# Patient Record
Sex: Female | Born: 1990 | Race: White | Hispanic: No | Marital: Single | Smoking: Former smoker
Health system: Southern US, Community
[De-identification: ages and names within clinical notes are randomized; demographics above are authoritative.]

## PROBLEM LIST (undated history)

## (undated) DIAGNOSIS — F319 Bipolar disorder, unspecified: Secondary | ICD-10-CM

## (undated) DIAGNOSIS — F1910 Other psychoactive substance abuse, uncomplicated: Secondary | ICD-10-CM

## (undated) DIAGNOSIS — B1920 Unspecified viral hepatitis C without hepatic coma: Secondary | ICD-10-CM

## (undated) DIAGNOSIS — D649 Anemia, unspecified: Secondary | ICD-10-CM

## (undated) HISTORY — PX: NO PAST SURGERIES: SHX2092

## (undated) HISTORY — DX: Bipolar disorder, unspecified: F31.9

## (undated) HISTORY — DX: Anemia, unspecified: D64.9

## (undated) HISTORY — DX: Other psychoactive substance abuse, uncomplicated: F19.10

## (undated) HISTORY — PX: OTHER SURGICAL HISTORY: SHX169

## (undated) HISTORY — PX: DENTAL SURGERY: SHX609

---
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
## 2018-04-23 NOTE — L&D Delivery Note (Signed)
Vaginal Delivery Note  Spontaneous delivery of live viable female infant from the ROA position through an episiotomy. Nuchal cord reduced x2. Delivery of anterior left shoulder with gentle downward guidance followed by delivery of the right posterior shoulder with gentle upward guidance. Body followed spontaneously. Infant placed on maternal chest. Nursery present and helped with neonatal resuscitation and evaluation. Cord clamped and cut after one minute. Cord blood not collected. Placenta delivered spontaneously and intact with a 3 vessel cord.  2nd degree laceration. Uterus firm and below umbilicus at the end of the delivery.  Mom and baby recovering in stable condition. Sponge and needle counts were correct at the end of the delivery.  APGARS: 1 minute:8 5 minutes: 9 Weight: 6lbs 11 oz Name: Brand Males MD [facility] 01/16/19 12:38 AM

## 2018-10-26 ENCOUNTER — Emergency Department
Admission: EM | Admit: 2018-10-26 | Discharge: 2018-10-26 | Disposition: A | Discharge status: Home / Self Care | Payer: Medicaid Other | Attending: Emergency Medicine | Admitting: Emergency Medicine

## 2018-10-26 ENCOUNTER — Encounter: Payer: Self-pay | Admitting: Emergency Medicine

## 2018-10-26 ENCOUNTER — Other Ambulatory Visit: Payer: Self-pay

## 2018-10-26 DIAGNOSIS — F17210 Nicotine dependence, cigarettes, uncomplicated: Secondary | ICD-10-CM | POA: Diagnosis not present

## 2018-10-26 DIAGNOSIS — O99322 Drug use complicating pregnancy, second trimester: Secondary | ICD-10-CM | POA: Insufficient documentation

## 2018-10-26 DIAGNOSIS — F1190 Opioid use, unspecified, uncomplicated: Secondary | ICD-10-CM | POA: Insufficient documentation

## 2018-10-26 DIAGNOSIS — Z7689 Persons encountering health services in other specified circumstances: Secondary | ICD-10-CM | POA: Insufficient documentation

## 2018-10-26 DIAGNOSIS — Z3A27 27 weeks gestation of pregnancy: Secondary | ICD-10-CM | POA: Diagnosis not present

## 2018-10-26 DIAGNOSIS — O99332 Smoking (tobacco) complicating pregnancy, second trimester: Secondary | ICD-10-CM | POA: Diagnosis not present

## 2018-10-26 MED ORDER — METHADONE HCL 10 MG/ML PO CONC
90.0000 mg | Freq: Once | ORAL | Status: AC
  Administered 2018-10-26: 90 mg via ORAL
  Filled 2018-10-26: qty 9

## 2018-10-26 NOTE — ED Triage Notes (Signed)
Pt here for dose of methadone, was seen at [facility] for the same yesterday, and will need another dose tomorrow. No complaints at this time.

## 2018-10-26 NOTE — ED Notes (Signed)
FIRST NURSE NOTE:  Pt here for dose of Methadone, pt does not have appt until Monday for Methadone clinic, Pt seen at [facility] yesterday and day before for same reason.

## 2018-10-26 NOTE — ED Notes (Signed)
See triage note  States she is here for her Methadone dose  Was given 1 yesterday at [facility]

## 2018-10-26 NOTE — Discharge Instructions (Signed)
Keep your intake appointment at the clinic on Tuesday.

## 2018-10-26 NOTE — ED Provider Notes (Signed)
[facility] Emergency Department Provider Note  ___________________________________________   First MD Initiated Contact with Patient 10/26/18 1302     (approximate)  I have reviewed the triage vital signs and the nursing notes.   HISTORY  Chief Complaint Med administration   HPI Margaret Krueger is a 28 y.o. female presents to the ED for a dose of her methadone.  Patient recently moved from [place] where she was being seen at a methadone clinic.  Patient currently is [redacted] weeks pregnant.  She is scheduled to have an intake appointment at a clinic on Tuesday.  Patient was seen at [facility] yesterday and the clinic that she was seen in [place] was called.  He was confirmed that her dose was 90 mg once a day.  Patient denies any other symptoms or complaints.      History reviewed. No pertinent past medical history.  There are no active problems to display for this patient.   History reviewed. No pertinent surgical history.  Prior to Admission medications   Not on File    Allergies Patient has no known allergies.  No family history on file.  Social History Social History   Tobacco Use  . Smoking status: Current Every Day Smoker    Packs/day: 0.50    Types: Cigarettes  . Smokeless tobacco: Never Used  Substance Use Topics  . Alcohol use: Not Currently  . Drug use: Not on file    Review of Systems Constitutional: No fever/chills Eyes: No visual changes. ENT: No sore throat. Cardiovascular: Denies chest pain. Respiratory: Denies shortness of breath. Gastrointestinal: No abdominal pain.  No nausea, no vomiting.  No diarrhea.  No constipation. Genitourinary: Negative for dysuria. Musculoskeletal: Negative for back pain. Skin: Negative for rash. Neurological: Negative for headaches, focal weakness or numbness. ___________________________________________   PHYSICAL EXAM:  VITAL SIGNS: ED Triage Vitals  Enc Vitals Group     BP  10/26/18 1307 127/68     Pulse Rate 10/26/18 1307 (!) 102     Resp --      Temp 10/26/18 1307 98.3 F (36.8 C)     Temp Source 10/26/18 1307 Oral     SpO2 10/26/18 1307 99 %     Weight 10/26/18 1229 160 lb (72.6 kg)     Height 10/26/18 1229 5\' 2"  (1.575 m)     Head Circumference --      Peak Flow --      Pain Score 10/26/18 1229 0     Pain Loc --      Pain Edu? --      Excl. in GC? --    Constitutional: Alert and oriented. Well appearing and in no acute distress. Eyes: Conjunctivae are normal.  Head: Atraumatic. Neck: No stridor.   Cardiovascular: Normal rate, regular rhythm. Grossly normal heart sounds.  Good peripheral circulation. Respiratory: Normal respiratory effort.  No retractions. Lungs CTAB. Musculoskeletal: Moves upper and lower extremities and normal gait was noted. Neurologic:  Normal speech and language. No gross focal neurologic deficits are appreciated. No gait instability. Skin:  Skin is warm, dry and intact. No rash noted. Psychiatric: Mood and affect are normal. Speech and behavior are normal.  ____________________________________________   LABS (all labs ordered are listed, but only abnormal results are displayed)  Labs Reviewed - No data to display  PROCEDURES  Procedure(s) performed (including Critical Care):  Procedures   ____________________________________________   INITIAL IMPRESSION / ASSESSMENT AND PLAN / ED COURSE  As part of my  medical decision making, I reviewed the following data within the electronic MEDICAL RECORD NUMBER Notes from prior ED visits and [place] Controlled Substance Database  28 year old female presents to the ED for medication administration.  Patient moved from [place] where she was being seen at a methadone clinic.  She was seen yesterday at [facility] who was able to confirm with her clinic and [place] her dosage.  Patient denies any other symptoms or complaints.  She has an appointment with a clinic on Tuesday.   ____________________________________________   FINAL CLINICAL IMPRESSION(S) / ED DIAGNOSES  Final diagnoses:  Encounter for medication administration     ED Discharge Orders    None       Note:  This document was prepared using Dragon voice recognition software and may include unintentional dictation errors.    Johnn Hai, PA-C 10/26/18 1400    Delman Kitten, MD 10/26/18 [phone]

## 2018-10-27 ENCOUNTER — Emergency Department
Admission: EM | Admit: 2018-10-27 | Discharge: 2018-10-27 | Disposition: A | Discharge status: Home / Self Care | Payer: Medicaid Other | Attending: Emergency Medicine | Admitting: Emergency Medicine

## 2018-10-27 ENCOUNTER — Other Ambulatory Visit: Payer: Self-pay

## 2018-10-27 DIAGNOSIS — F17210 Nicotine dependence, cigarettes, uncomplicated: Secondary | ICD-10-CM | POA: Diagnosis not present

## 2018-10-27 DIAGNOSIS — Z008 Encounter for other general examination: Secondary | ICD-10-CM | POA: Insufficient documentation

## 2018-10-27 DIAGNOSIS — Z79899 Other long term (current) drug therapy: Secondary | ICD-10-CM | POA: Diagnosis not present

## 2018-10-27 MED ORDER — METHADONE HCL 10 MG PO TABS
90.0000 mg | ORAL_TABLET | Freq: Once | ORAL | Status: AC
  Administered 2018-10-27: 90 mg via ORAL
  Filled 2018-10-27: qty 9

## 2018-10-27 NOTE — ED Provider Notes (Signed)
[facility] Emergency Department Provider Note  ____________________________________________  Time seen: Approximately 5:09 PM  I have reviewed the triage vital signs and the nursing notes.   HISTORY  Chief Complaint Other (Needs Methadone)    HPI Margaret Krueger is a 28 y.o. female presents emergency department for her methadone dose.  Patient moved here from [place] and has been unable to get into a methadone clinic over 4 July holiday weekend.  Patient states that she has an intake appointment tomorrow before 9 AM.  She believes that it is in [place] but her boyfriend has the details.  She has been on methadone for about 1 month.  She states that she will go through withdrawals if she does not take this medication.  She takes 90 mg.  She was given referrals to OB[facility]N both at [facility] and here over the weekend but has not yet established care.  She is waiting for her mother-in-law, who has pinkeye to be able to go with her.  No concerns.   History reviewed. No pertinent past medical history.  There are no active problems to display for this patient.   History reviewed. No pertinent surgical history.  Prior to Admission medications   Not on File    Allergies Patient has no known allergies.  No family history on file.  Social History Social History   Tobacco Use  . Smoking status: Current Every Day Smoker    Packs/day: 0.50    Types: Cigarettes  . Smokeless tobacco: Never Used  Substance Use Topics  . Alcohol use: Not Currently  . Drug use: Not on file     Review of Systems  Respiratory: No SOB. Gastrointestinal: No abdominal pain.  No nausea, no vomiting.  Musculoskeletal: Negative for musculoskeletal pain. Skin: Negative for rash, abrasions, lacerations, ecchymosis.   ____________________________________________   PHYSICAL EXAM:  VITAL SIGNS: ED Triage Vitals  Enc Vitals Group     BP 10/27/18 1535 130/74     Pulse Rate 10/27/18  1535 (!) 108     Resp 10/27/18 1535 18     Temp 10/27/18 1535 98 F (36.7 C)     Temp Source 10/27/18 1535 Oral     SpO2 10/27/18 1535 98 %     Weight 10/27/18 1536 160 lb (72.6 kg)     Height 10/27/18 1536 5\' 2"  (1.575 m)     Head Circumference --      Peak Flow --      Pain Score --      Pain Loc --      Pain Edu? --      Excl. in GC? --      Constitutional: Alert and oriented. Well appearing and in no acute distress. Eyes: Conjunctivae are normal. PERRL. EOMI. Head: Atraumatic. ENT:      Ears:      Nose: No congestion/rhinnorhea.      Mouth/Throat: Mucous membranes are moist.  Neck: No stridor.  Cardiovascular: Good peripheral circulation. Respiratory: Normal respiratory effort without tachypnea or retractions. Musculoskeletal: Full range of motion to all extremities. No gross deformities appreciated. Neurologic:  Normal speech and language. No gross focal neurologic deficits are appreciated.  Skin:  Skin is warm, dry and intact. No rash noted. Psychiatric: Mood and affect are normal. Speech and behavior are normal. Patient exhibits appropriate insight and judgement.   ____________________________________________   LABS (all labs ordered are listed, but only abnormal results are displayed)  Labs Reviewed - No data to display ____________________________________________  EKG   ____________________________________________  RADIOLOGY   No results found.  ____________________________________________    PROCEDURES  Procedure(s) performed:    Procedures    Medications  methadone (DOLOPHINE) tablet 90 mg (90 mg Oral Given 10/27/18 1738)     ____________________________________________   INITIAL IMPRESSION / ASSESSMENT AND PLAN / ED COURSE  Pertinent labs & imaging results that were available during my care of the patient were reviewed by me and considered in my medical decision making (see chart for details).  Review of the [place] CSRS was performed  in accordance of the [place] prior to dispensing any controlled drugs.     Patient presented to the emergency department for her daily methadone dose.  Vital signs and exam are reassuring.  Methadone dose was confirmed by [facility] on Saturday with her provider in [place].  90 mg of methadone was given in the emergency department.  Patient has her intake appointment tomorrow morning prior to 9 AM.  Patient is to follow up with methadone clinic and OB as directed. Patient is given ED precautions to return to the ED for any worsening or new symptoms.  Margaret Krueger was evaluated in Emergency Department on 10/27/2018 for the symptoms described in the history of present illness. She was evaluated in the context of the global COVID-19 pandemic, which necessitated consideration that the patient might be at risk for infection with the SARS-CoV-2 virus that causes COVID-19. Institutional protocols and algorithms that pertain to the evaluation of patients at risk for COVID-19 are in a state of rapid change based on information released by regulatory bodies including the CDC and federal and state organizations. These policies and algorithms were followed during the patient's care in the ED.  ____________________________________________  FINAL CLINICAL IMPRESSION(S) / ED DIAGNOSES  Final diagnoses:  Medication management      NEW MEDICATIONS STARTED DURING THIS VISIT:  ED Discharge Orders    None          This chart was dictated using voice recognition software/Dragon. Despite best efforts to proofread, errors can occur which can change the meaning. Any change was purely unintentional.    Laban Emperor, PA-C 10/27/18 1817    Earleen Newport, MD 10/27/18 Bosie Helper

## 2018-10-27 NOTE — ED Notes (Signed)
See triage note  Presents for Methadone dose  States she takes 90 mg daily  Is scheduled to started with clinic tomorrow

## 2018-10-27 NOTE — ED Triage Notes (Signed)
Pt to ER requesting her methadone dose. Pt had recent move and does not have her intake appt for methadone clinic tomorrow. No complaints. Pt alert and oriented X4, active, cooperative, pt in NAD. RR even and unlabored, color WNL.

## 2018-10-27 NOTE — Discharge Instructions (Addendum)
Please follow-up with methadone clinic and also establish with OB.

## 2018-11-14 ENCOUNTER — Other Ambulatory Visit: Payer: Self-pay

## 2018-11-14 ENCOUNTER — Emergency Department
Admission: EM | Admit: 2018-11-14 | Discharge: 2018-11-14 | Disposition: A | Discharge status: Home / Self Care | Payer: Medicaid Other | Attending: Emergency Medicine | Admitting: Emergency Medicine

## 2018-11-14 ENCOUNTER — Encounter: Payer: Self-pay | Admitting: Emergency Medicine

## 2018-11-14 DIAGNOSIS — F1190 Opioid use, unspecified, uncomplicated: Secondary | ICD-10-CM | POA: Insufficient documentation

## 2018-11-14 DIAGNOSIS — Z760 Encounter for issue of repeat prescription: Secondary | ICD-10-CM

## 2018-11-14 DIAGNOSIS — O99332 Smoking (tobacco) complicating pregnancy, second trimester: Secondary | ICD-10-CM | POA: Insufficient documentation

## 2018-11-14 DIAGNOSIS — O99322 Drug use complicating pregnancy, second trimester: Secondary | ICD-10-CM | POA: Diagnosis not present

## 2018-11-14 DIAGNOSIS — F17210 Nicotine dependence, cigarettes, uncomplicated: Secondary | ICD-10-CM | POA: Diagnosis not present

## 2018-11-14 MED ORDER — METHADONE HCL 10 MG PO TABS
35.0000 mg | ORAL_TABLET | Freq: Once | ORAL | Status: AC
  Administered 2018-11-14: 35 mg via ORAL
  Filled 2018-11-14: qty 4

## 2018-11-14 NOTE — ED Triage Notes (Signed)
Pt presents to ED via POV with c/o medication refill. Pt states receives 90mg  Methadone from the methadone clinic daily. Pt states she is [redacted] weeks pregnant at this time, missed her appt for today and was directed here.

## 2018-11-14 NOTE — ED Provider Notes (Signed)
[facility] Emergency Department Provider Note  ____________________________________________   I have reviewed the triage vital signs and the nursing notes. Where available I have reviewed prior notes and, if possible and indicated, outside hospital notes.   Patient seen and evaluated during the coronavirus epidemic during a time with low staffing  Patient seen for the symptoms described in the history of present illness. She was evaluated in the context of the global COVID-19 pandemic, which necessitated consideration that the patient might be at risk for infection with the SARS-CoV-2 virus that causes COVID-19. Institutional protocols and algorithms that pertain to the evaluation of patients at risk for COVID-19 are in a state of rapid change based on information released by regulatory bodies including the CDC and federal and state organizations. These policies and algorithms were followed during the patient's care in the ED.    HISTORY  Chief Complaint Medication Refill    HPI Margaret Krueger is a 28 y.o. female who presents today complaining of wanting methadone.  Patient is on baseline 90 mg of methadone a day.  She mated to her methadone clinic have her lately did not give her the methadone.  She is [redacted] weeks pregnant does not want to go through withdrawal.  No withdrawal symptoms.  No vaginal cramping or bleeding.  She is not experiencing abdominal pain or complications of pregnancy.  Is feeling the baby move.  No other complaints     History reviewed. No pertinent past medical history.  There are no active problems to display for this patient.   History reviewed. No pertinent surgical history.  Prior to Admission medications   Not on File    Allergies Patient has no known allergies.  No family history on file.  Social History Social History   Tobacco Use  . Smoking status: Current Every Day Smoker    Packs/day: 0.50    Types: Cigarettes  .  Smokeless tobacco: Never Used  Substance Use Topics  . Alcohol use: Not Currently  . Drug use: Not Currently    Review of Systems Constitutional: No fever/chills Eyes: No visual changes. ENT: No sore throat. No stiff neck no neck pain Cardiovascular: Denies chest pain. Respiratory: Denies shortness of breath. Gastrointestinal:   no vomiting.  No diarrhea.  No constipation. Genitourinary: Negative for dysuria. Musculoskeletal: Negative lower extremity swelling Skin: Negative for rash. Neurological: Negative for severe headaches, focal weakness or numbness.   ____________________________________________   PHYSICAL EXAM:  VITAL SIGNS: ED Triage Vitals  Enc Vitals Group     BP 11/14/18 1304 116/67     Pulse Rate 11/14/18 1304 (!) 107     Resp 11/14/18 1304 20     Temp 11/14/18 1304 98.8 F (37.1 C)     Temp Source 11/14/18 1304 Oral     SpO2 11/14/18 1304 98 %     Weight 11/14/18 1303 160 lb (72.6 kg)     Height 11/14/18 1303 5\' 3"  (1.6 m)     Head Circumference --      Peak Flow --      Pain Score 11/14/18 1305 0     Pain Loc --      Pain Edu? --      Excl. in [place]? --     Constitutional: Alert and oriented. Well appearing and in no acute distress. Eyes: Conjunctivae are normal Head: Atraumatic HEENT: No congestion/rhinnorhea. Mucous membranes are moist.  Oropharynx non-erythematous Neck:   Nontender with no meningismus, no masses, no stridor  Cardiovascular: Normal rate, regular rhythm. Grossly normal heart sounds.  Good peripheral circulation. Respiratory: Normal respiratory effort.  No retractions. Lungs CTAB. Abdominal: Soft and nontender. No distention. No guarding no rebound gravid uterus palpated    ____________________________________________   LABS (all labs ordered are listed, but only abnormal results are displayed)  Labs Reviewed - No data to display  Pertinent labs  results that were available during my care of the patient were reviewed by me and  considered in my medical decision making (see chart for details). ____________________________________________  EKG  I personally interpreted any EKGs ordered by me or triage  ____________________________________________  RADIOLOGY  Pertinent labs & imaging results that were available during my care of the patient were reviewed by me and considered in my medical decision making (see chart for details). If possible, patient and/or family made aware of any abnormal findings.  No results found. ____________________________________________    PROCEDURES  Procedure(s) performed: None  Procedures  Critical Care performed: None  ____________________________________________   INITIAL IMPRESSION / ASSESSMENT AND PLAN / ED COURSE  Pertinent labs & imaging results that were available during my care of the patient were reviewed by me and considered in my medical decision making (see chart for details).   Patient presents for the 6 time  to emergency departments this month since moving to [place] for methadone.  I have explained her this is not appropriate.  Obviously I do not want her to withdraw from methadone either.  Patient [facility].  I will give her 35 mg which should be enough to forestall withdrawal but again have explained to her that this is not the role of the emergency department.  Did attempt to call her methadone clinic but there was no answer.    ____________________________________________   FINAL CLINICAL IMPRESSION(S) / ED DIAGNOSES  Final diagnoses:  Medication refill      This chart was dictated using voice recognition software.  Despite best efforts to proofread,  errors can occur which can change meaning.      Jeanmarie PlantMcShane,  A, MD 11/14/18 [phone]

## 2018-11-14 NOTE — Discharge Instructions (Addendum)
Please go to your methadone clinic first thing tomorrow morning.  It is not appropriate for the ER to be giving out methadone.  We strongly advised you stick to your scheduled appointments.  Return to the emergency room for worrisome symptoms.

## 2018-11-20 ENCOUNTER — Other Ambulatory Visit: Payer: Self-pay

## 2018-11-20 ENCOUNTER — Ambulatory Visit (INDEPENDENT_AMBULATORY_CARE_PROVIDER_SITE_OTHER): Payer: Medicaid Other | Admitting: Advanced Practice Midwife

## 2018-11-20 ENCOUNTER — Other Ambulatory Visit (HOSPITAL_COMMUNITY)
Admission: RE | Admit: 2018-11-20 | Discharge: 2018-11-20 | Disposition: A | Discharge status: Home / Self Care | Payer: Medicaid Other | Source: Ambulatory Visit | Attending: Advanced Practice Midwife | Admitting: Advanced Practice Midwife

## 2018-11-20 ENCOUNTER — Encounter: Payer: Self-pay | Admitting: Advanced Practice Midwife

## 2018-11-20 VITALS — BP 118/78 | Wt 161.0 lb

## 2018-11-20 DIAGNOSIS — O9A413 Sexual abuse complicating pregnancy, third trimester: Secondary | ICD-10-CM

## 2018-11-20 DIAGNOSIS — Z124 Encounter for screening for malignant neoplasm of cervix: Secondary | ICD-10-CM | POA: Insufficient documentation

## 2018-11-20 DIAGNOSIS — O0990 Supervision of high risk pregnancy, unspecified, unspecified trimester: Secondary | ICD-10-CM | POA: Insufficient documentation

## 2018-11-20 DIAGNOSIS — Z113 Encounter for screening for infections with a predominantly sexual mode of transmission: Secondary | ICD-10-CM | POA: Insufficient documentation

## 2018-11-20 DIAGNOSIS — Z3A32 32 weeks gestation of pregnancy: Secondary | ICD-10-CM

## 2018-11-20 NOTE — Patient Instructions (Signed)
Perinatal Depression When a woman feels excessive sadness, anger, or anxiety during pregnancy or during the first 12 months after she gives birth, she has a condition called perinatal depression. Depression can interfere with work, school, relationships, and other everyday activities. If it is not managed properly, it can also cause problems in the mother and her baby. Sometimes, perinatal depression is left untreated because symptoms are thought to be normal mood swings during and right after pregnancy. If you have symptoms of depression, it is important to talk with your health care provider. What are the causes? The exact cause of this condition is not known. Hormonal changes during and after pregnancy may play a role in causing perinatal depression. What increases the risk? You are more likely to develop this condition if:  You have a personal or family history of depression, anxiety, or mood disorders.  You experience a stressful life event during pregnancy, such as the death of a loved one.  You have a lot of regular life stress.  You do not have support from family members or loved ones, or you are in an abusive relationship. What are the signs or symptoms? Symptoms of this condition include:  Feeling sad or hopeless.  Feelings of guilt.  Feeling irritable or overwhelmed.  Changes in your appetite.  Lack of energy or motivation.  Sleep problems.  Difficulty concentrating or completing tasks.  Loss of interest in hobbies or relationships.  Headaches or stomach problems that do not go away. How is this diagnosed? This condition is diagnosed based on a physical exam and mental evaluation. In some cases, your health care provider may use a depression screening tool. These tools include a list of questions that can help a health care provider diagnose depression. Your health care provider may refer you to a mental health expert who specializes in depression. How is this  treated? This condition may be treated with:  Medicines. Your health care provider will only give you medicines that have been proven safe for pregnancy and breastfeeding.  Talk therapy with a mental health professional to help change your patterns of thinking (cognitive behavioral therapy).  Support groups.  Brain stimulation or light therapies.  Stress reduction therapies, such as mindfulness. Follow these instructions at home: Lifestyle  Do not use any products that contain nicotine or tobacco, such as cigarettes and e-cigarettes. If you need help quitting, ask your health care provider.  Do not use alcohol when you are pregnant. After your baby is born, limit alcohol intake to no more than 1 drink a day. One drink equals 12 oz of beer, 5 oz of wine, or 1 oz of hard liquor.  Consider joining a support group for new mothers. Ask your health care provider for recommendations.  Take good care of yourself. Make sure you: ? Get plenty of sleep. If you are having trouble sleeping, talk with your health care provider. ? Eat a healthy diet. This includes plenty of fruits and vegetables, whole grains, and lean proteins. ? Exercise regularly, as told by your health care provider. Ask your health care provider what exercises are safe for you. General instructions  Take over-the-counter and prescription medicines only as told by your health care provider.  Talk with your partner or family members about your feelings during pregnancy. Share any concerns or anxieties that you may have.  Ask for help with tasks or chores when you need it. Ask friends and family members to provide meals, watch your children, or help with  cleaning.  Keep all follow-up visits as told by your health care provider. This is important. Contact a health care provider if:  You (or people close to you) notice that you have any symptoms of depression.  You have depression and your symptoms get worse.  You  experience side effects from medicines, such as nausea or sleep problems. Get help right away if:  You feel like hurting yourself, your baby, or someone else. If you ever feel like you may hurt yourself or others, or have thoughts about taking your own life, get help right away. You can go to your nearest emergency department or call:  Your local emergency services (911 in the [place]  A suicide crisis helpline, such as the [place] at 516 164 7513. This is open 24 hours a day. Summary  Perinatal depression is when a woman feels excessive sadness, anger, or anxiety during pregnancy or during the first 12 months after she gives birth.  If perinatal depression is not treated, it can lead to health problems for the mother and her baby.  This condition is treated with medicines, talk therapy, stress reduction therapies, or a combination of two or more treatments.  Talk with your partner or family members about your feelings. Do not be afraid to ask for help. This information is not intended to replace advice given to you by your health care provider. Make sure you discuss any questions you have with your health care provider. Document Released: 06/06/2016 Document Revised: 04/12/2017 Document Reviewed: 06/06/2016 Elsevier Patient Education  [place]. Exercise During Pregnancy Exercise is an important part of being healthy for people of all ages. Exercise improves the function of your heart and lungs and helps you maintain strength, flexibility, and a healthy body weight. Exercise also boosts energy levels and elevates mood. Most women should exercise regularly during pregnancy. In rare cases, women with certain medical conditions or complications may be asked to limit or avoid exercise during pregnancy. How does this affect me? Along with maintaining general strength and flexibility, exercising during pregnancy can help:  Keep strength in muscles that are  used during labor and childbirth.  Decrease low back pain.  Reduce symptoms of depression.  Control weight gain during pregnancy.  Reduce the risk of needing insulin if you develop diabetes during pregnancy.  Decrease the risk of cesarean delivery.  Speed up your recovery after giving birth. How does this affect my baby? Exercise can help you have a healthy pregnancy. Exercise does not cause premature birth. It will not cause your baby to weigh less at birth. What exercises can I do? Many exercises are safe for you to do during pregnancy. Do a variety of exercises that safely increase your heart and breathing rates and help you build and maintain muscle strength. Do exercises exactly as told by your health care provider. You may do these exercises:  Walking or hiking.  Swimming.  Water aerobics.  Riding a stationary bike.  Strength training.  Modified yoga or Pilates. Tell your instructor that you are pregnant. Avoid overstretching, and avoid lying on your back for long periods of time.  Running or jogging. Only choose this type of exercise if you: ? Ran or jogged regularly before your pregnancy. ? Can run or jog and still talk in complete sentences. What exercises should I avoid? Depending on your level of fitness and whether you exercised regularly before your pregnancy, you may be told to limit high-intensity exercise. You can tell that you  are exercising at a high intensity if you are breathing much harder and faster and cannot hold a conversation while exercising. You must avoid:  Contact sports.  Activities that put you at risk for falling on or being hit in the belly, such as downhill skiing, water skiing, surfing, rock climbing, cycling, gymnastics, and horseback riding.  Scuba diving.  Skydiving.  Yoga or Pilates in a room that is heated to high temperatures.  Jogging or running, unless you ran or jogged regularly before your pregnancy. While jogging or running,  you should always be able to talk in full sentences. Do not run or jog so fast that you are unable to have a conversation.  Do not exercise at more than 6,000 feet above sea level (high elevation) if you are not used to exercising at high elevation. How do I exercise in a safe way?   Avoid overheating. Do not exercise in very high temperatures.  Wear loose-fitting, breathable clothes.  Avoid dehydration. Drink enough water before, during, and after exercise to keep your urine pale yellow.  Avoid overstretching. Because of hormone changes during pregnancy, it is easy to overstretch muscles, tendons, and ligaments during pregnancy.  Start slowly and ask your health care provider to recommend the types of exercise that are safe for you.  Do not exercise to lose weight. Follow these instructions at home:  Exercise on most days or all days of the week. Try to exercise for 30 minutes a day, 5 days a week, unless your health care provider tells you not to.  If you actively exercised before your pregnancy and you are healthy, your health care provider may tell you to continue to do moderate to high-intensity exercise.  If you are just starting to exercise or did not exercise much before your pregnancy, your health care provider may tell you to do low to moderate-intensity exercise. Questions to ask your health care provider  Is exercise safe for me?  What are signs that I should stop exercising?  Does my health condition mean that I should not exercise during pregnancy?  When should I avoid exercising during pregnancy? Stop exercising and contact a health care provider if: You have any unusual symptoms, such as:  Mild contractions of the uterus or cramps in the abdomen.  Dizziness that does not go away when you rest. Stop exercising and get help right away if: You have any unusual symptoms, such as:  Sudden, severe pain in your low back or your belly.  Mild contractions of the  uterus or cramps in the abdomen that do not improve with rest and drinking fluids.  Chest pain.  Bleeding or fluid leaking from your vagina.  Shortness of breath. These symptoms may represent a serious problem that is an emergency. Do not wait to see if the symptoms will go away. Get medical help right away. Call your local emergency services (911 in the [place] Do not drive yourself to the hospital. Summary  Most women should exercise regularly throughout pregnancy. In rare cases, women with certain medical conditions or complications may be asked to limit or avoid exercise during pregnancy.  Do not exercise to lose weight during pregnancy.  Your health care provider will tell you what level of physical activity is right for you.  Stop exercising and contact a health care provider if you have mild contractions of the uterus or cramps in the abdomen. Get help right away if these contractions or cramps do not improve with rest and  drinking fluids.  Stop exercising and get help right away if you have sudden, severe pain in your low back or belly, chest pain, shortness of breath, or bleeding or leaking of fluid from your vagina. This information is not intended to replace advice given to you by your health care provider. Make sure you discuss any questions you have with your health care provider. Document Released: 04/09/2005 Document Revised: 07/31/2018 Document Reviewed: 05/14/2018 Elsevier Patient Education  2020 [employer] for Pregnant Women While you are pregnant, your body requires additional nutrition to help support your growing baby. You also have a higher need for some vitamins and minerals, such as folic acid, calcium, iron, and vitamin D. Eating a healthy, well-balanced diet is very important for your health and your baby's health. Your need for extra calories varies for the three 80-monthsegments of your pregnancy (trimesters). For most women, it is recommended to  consume:  150 extra calories a day during the first trimester.  300 extra calories a day during the second trimester.  300 extra calories a day during the third trimester. What are tips for following this plan?   Do not try to lose weight or go on a diet during pregnancy.  Limit your overall intake of foods that have "empty calories." These are foods that have little nutritional value, such as sweets, desserts, candies, and sugar-sweetened beverages.  Eat a variety of foods (especially fruits and vegetables) to get a full range of vitamins and minerals.  Take a prenatal vitamin to help meet your additional vitamin and mineral needs during pregnancy, specifically for folic acid, iron, calcium, and vitamin D.  Remember to stay active. Ask your health care provider what types of exercise and activities are safe for you.  Practice good food safety and cleanliness. Wash your hands before you eat and after you prepare raw meat. Wash all fruits and vegetables well before peeling or eating. Taking these actions can help to prevent food-borne illnesses that can be very dangerous to your baby, such as listeriosis. Ask your health care provider for more information about listeriosis. What does 150 extra calories look like? Healthy options that provide 150 extra calories each day could be any of the following:  6-8 oz (170-230 g) of plain low-fat yogurt with  cup of berries.  1 apple with 2 teaspoons (11 g) of peanut butter.  Cut-up vegetables with  cup (60 g) of hummus.  8 oz (230 mL) or 1 cup of low-fat chocolate milk.  1 stick of string cheese with 1 medium orange.  1 peanut butter and jelly sandwich that is made with one slice of whole-wheat bread and 1 tsp (5 g) of peanut butter. For 300 extra calories, you could eat two of those healthy options each day. What is a healthy amount of weight to gain? The right amount of weight gain for you is based on your BMI before you became  pregnant. If your BMI:  Was less than 18 (underweight), you should gain 28-40 lb (13-18 kg).  Was 18-24.9 (normal), you should gain 25-35 lb (11-16 kg).  Was 25-29.9 (overweight), you should gain 15-25 lb (7-11 kg).  Was 30 or greater (obese), you should gain 11-20 lb (5-9 kg). What if I am having twins or multiples? Generally, if you are carrying twins or multiples:  You may need to eat 300-600 extra calories a day.  The recommended range for total weight gain is 25-54 lb (11-25 kg), depending on your  BMI before pregnancy.  Talk with your health care provider to find out about nutritional needs, weight gain, and exercise that is right for you. What foods can I eat?  Grains All grains. Choose whole grains, such as whole-wheat bread, oatmeal, or brown rice. Vegetables All vegetables. Eat a variety of colors and types of vegetables. Remember to wash your vegetables well before peeling or eating. Fruits All fruits. Eat a variety of colors and types of fruit. Remember to wash your fruits well before peeling or eating. Meats and other protein foods Lean meats, including chicken, [place], fish, and lean cuts of beef, veal, or pork. If you eat fish or seafood, choose options that are higher in omega-3 fatty acids and lower in mercury, such as salmon, herring, mussels, trout, sardines, pollock, shrimp, crab, and lobster. Tofu. Tempeh. Beans. Eggs. Peanut butter and other nut butters. Make sure that all meats, poultry, and eggs are cooked to food-safe temperatures or "well-done." Two or more servings of fish are recommended each week in order to get the most benefits from omega-3 fatty acids that are found in seafood. Choose fish that are lower in mercury. You can find more information online:  [employer]ch Dairy Pasteurized milk and milk alternatives (such as almond milk). Pasteurized yogurt and pasteurized cheese. Cottage cheese. Sour cream. Beverages Water. Juices that contain 100% fruit  juice or vegetable juice. Caffeine-free teas and decaffeinated coffee. Drinks that contain caffeine are okay to drink, but it is better to avoid caffeine. Keep your total caffeine intake to less than 200 mg each day (which is 12 oz or 355 mL of coffee, tea, or soda) or the limit as told by your health care provider. Fats and oils Fats and oils are okay to include in moderation. Sweets and desserts Sweets and desserts are okay to include in moderation. Seasoning and other foods All pasteurized condiments. The items listed above may not be a complete list of recommended foods and beverages. Contact your dietitian for more options. The items listed above may not be a complete list of foods and beverages [you/your child] can eat. Contact a dietitian for more information. What foods are not recommended? Vegetables Raw (unpasteurized) vegetable juices. Fruits Unpasteurized fruit juices. Meats and other protein foods Lunch meats, bologna, hot dogs, or other deli meats. (If you must eat those meats, reheat them until they are steaming hot.) Refrigerated pat, meat spreads from a meat counter, smoked seafood that is found in the refrigerated section of a store. Raw or undercooked meats, poultry, and eggs. Raw fish, such as sushi or sashimi. Fish that have high mercury content, such as tilefish, shark, swordfish, and king mackerel. To learn more about mercury in fish, talk with your health care provider or look for online resources, such as:  [employer] Dairy Raw (unpasteurized) milk and any foods that have raw milk in them. Soft cheeses, such as feta, queso blanco, queso fresco, Brie, Camembert cheeses, blue-veined cheeses, and Panela cheese (unless it is made with pasteurized milk, which must be stated on the label). Beverages Alcohol. Sugar-sweetened beverages, such as sodas, teas, or energy drinks. Seasoning and other foods Homemade fermented foods and drinks, such as pickles, sauerkraut, or  kombucha drinks. (Store-bought pasteurized versions of these are okay.) Salads that are made in a store or deli, such as ham salad, chicken salad, egg salad, tuna salad, and seafood salad. The items listed above may not be a complete list of foods and beverages to avoid. Contact your dietitian for more information.  The items listed above may not be a complete list of foods and beverages [you/your child] should avoid. Contact a dietitian for more information. Where to find more information To calculate the number of calories you need based on your height, weight, and activity level, you can use an online calculator such as:  MobileTransition.ch To calculate how much weight you should gain during pregnancy, you can use an online pregnancy weight gain calculator such as:  Streaming[employer].com.cy Summary  While you are pregnant, your body requires additional nutrition to help support your growing baby.  Eat a variety of foods, especially fruits and vegetables to get a full range of vitamins and minerals.  Practice good food safety and cleanliness. Wash your hands before you eat and after you prepare raw meat. Wash all fruits and vegetables well before peeling or eating. Taking these actions can help to prevent food-borne illnesses, such as listeriosis, that can be very dangerous to your baby.  Do not eat raw meat or fish. Do not eat fish that have high mercury content, such as tilefish, shark, swordfish, and king mackerel. Do not eat unpasteurized (raw) dairy.  Take a prenatal vitamin to help meet your additional vitamin and mineral needs during pregnancy, specifically for folic acid, iron, calcium, and vitamin D. This information is not intended to replace advice given to you by your health care provider. Make sure you discuss any questions you have with your health care provider. Document Released: 01/22/2014 Document Revised: 07/31/2018 Document  Reviewed: 01/04/2017 Elsevier Patient Education  2020 [employer]. Prenatal Care Prenatal care is health care during pregnancy. It helps you and your unborn baby (fetus) stay as healthy as possible. Prenatal care may be provided by a midwife, a family practice health care provider, or a childbirth and pregnancy specialist (obstetrician). How does this affect me? During pregnancy, you will be closely monitored for any new conditions that might develop. To lower your risk of pregnancy complications, you and your health care provider will talk about any underlying conditions you have. How does this affect my baby? Early and consistent prenatal care increases the chance that your baby will be healthy during pregnancy. Prenatal care lowers the risk that your baby will be:  Born early (prematurely).  Smaller than expected at birth (small for gestational age). What can I expect at the first prenatal care visit? Your first prenatal care visit will likely be the longest. You should schedule your first prenatal care visit as soon as you know that you are pregnant. Your first visit is a good time to talk about any questions or concerns you have about pregnancy. At your visit, you and your health care provider will talk about:  Your medical history, including: ? Any past pregnancies. ? Your family's medical history. ? The baby's father's medical history. ? Any long-term (chronic) health conditions you have and how you manage them. ? Any surgeries or procedures you have had. ? Any current over-the-counter or prescription medicines, herbs, or supplements you are taking.  Other factors that could pose a risk to your baby, including:  Your home setting and your stress levels, including: ? Exposure to abuse or violence. ? Household financial strain. ? Mental health conditions you have.  Your daily health habits, including diet and exercise. Your health care provider will also:  Measure your weight,  height, and blood pressure.  Do a physical exam, including a pelvic and breast exam.  Perform blood tests and urine tests to check for: ? Urinary tract  infection. ? Sexually transmitted infections (STIs). ? Low iron levels in your blood (anemia). ? Blood type and certain proteins on red blood cells (Rh antibodies). ? Infections and immunity to viruses, such as hepatitis B and rubella. ? HIV (human immunodeficiency virus).  Do an ultrasound to confirm your baby's growth and development and to help predict your estimated due date (EDD). This ultrasound is done with a probe that is inserted into the vagina (transvaginal ultrasound).  Discuss your options for genetic screening.  Give you information about how to keep yourself and your baby healthy, including: ? Nutrition and taking vitamins. ? Physical activity. ? How to manage pregnancy symptoms such as nausea and vomiting (morning sickness). ? Infections and substances that may be harmful to your baby and how to avoid them. ? Food safety. ? Dental care. ? Working. ? Travel. ? Warning signs to watch for and when to call your health care provider. How often will I have prenatal care visits? After your first prenatal care visit, you will have regular visits throughout your pregnancy. The visit schedule is often as follows:  Up to week 28 of pregnancy: once every 4 weeks.  28-36 weeks: once every 2 weeks.  After 36 weeks: every week until delivery. Some women may have visits more or less often depending on any underlying health conditions and the health of the baby. Keep all follow-up and prenatal care visits as told by your health care provider. This is important. What happens during routine prenatal care visits? Your health care provider will:  Measure your weight and blood pressure.  Check for fetal heart sounds.  Measure the height of your uterus in your abdomen (fundal height). This may be measured starting around week 20 of  pregnancy.  Check the position of your baby inside your uterus.  Ask questions about your diet, sleeping patterns, and whether you can feel the baby move.  Review warning signs to watch for and signs of labor.  Ask about any pregnancy symptoms you are having and how you are dealing with them. Symptoms may include: ? Headaches. ? Nausea and vomiting. ? Vaginal discharge. ? Swelling. ? Fatigue. ? Constipation. ? Any discomfort, including back or pelvic pain. Make a list of questions to ask your health care provider at your routine visits. What tests might I have during prenatal care visits? You may have blood, urine, and imaging tests throughout your pregnancy, such as:  Urine tests to check for glucose, protein, or signs of infection.  Glucose tests to check for a form of diabetes that can develop during pregnancy (gestational diabetes mellitus). This is usually done around week 24 of pregnancy.  An ultrasound to check your baby's growth and development and to check for birth defects. This is usually done around week 20 of pregnancy.  A test to check for group B strep (GBS) infection. This is usually done around week 36 of pregnancy.  Genetic testing. This may include blood or imaging tests, such as an ultrasound. Some genetic tests are done during the first trimester and some are done during the second trimester. What else can I expect during prenatal care visits? Your health care provider may recommend getting certain vaccines during pregnancy. These may include:  A yearly flu shot (annual influenza vaccine). This is especially important if you will be pregnant during flu season.  Tdap (tetanus, diphtheria, pertussis) vaccine. Getting this vaccine during pregnancy can protect your baby from whooping cough (pertussis) after birth. This vaccine may be  recommended between weeks 27 and 36 of pregnancy. Later in your pregnancy, your health care provider may give you information about:   Childbirth and breastfeeding classes.  Choosing a health care provider for your baby.  Umbilical cord banking.  Breastfeeding.  Birth control after your baby is born.  The hospital labor and delivery unit and how to tour it.  Registering at the hospital before you go into labor. Where to find more information  Office on Women's Health: LegalWarrants.gl  American Pregnancy Association: americanpregnancy.org  March of Dimes: marchofdimes.org Summary  Prenatal care helps you and your baby stay as healthy as possible during pregnancy.  Your first prenatal care visit will most likely be the longest.  You will have visits and tests throughout your pregnancy to monitor your health and your baby's health.  Bring a list of questions to your visits to ask your health care provider.  Make sure to keep all follow-up and prenatal care visits with your health care provider. This information is not intended to replace advice given to you by your health care provider. Make sure you discuss any questions you have with your health care provider. Document Released: 04/12/2003 Document Revised: 07/30/2018 Document Reviewed: 04/08/2017 Elsevier Patient Education  2020 [employer].    COVID-19 and Your Pregnancy FAQ  How can I prevent infection with COVID-19 during my pregnancy? Social distancing is key. Please limit any interactions in public. Try and work from home if possible. Frequently wash your hands after touching possibly contaminated surfaces. Avoid touching your face.  Minimize trips to the store. Consider online ordering when possible.   Should I wear a mask? YES. It is recommended by the CDC that all people wear a cloth mask or facial covering in public. You should wear a mask to your visits in the office. This will help reduce transmission as well as your risk or acquiring COVID-19. New studies are showing that even asymptomatic individuals can spread the virus from talking.    Where can I get a mask? [place] and the city of [place] are partnering to provide masks to community members. You can pick up a mask from several locations. This website also has instructions about how to make a mask by sewing or without sewing by using a t-shirt or bandana.  https://www.[place].gov/i-want-to/learn-about/covid-19-information-and-updates/covid-19-face-mask-project  Studies have shown that if you were a tube or nylon stocking from pantyhose over a cloth mask it makes the cloth mask almost as effective as a N95 mask.  https://www.davis-walter.com/  What are the symptoms of COVID-19? Fever (greater than 100.4 F), dry cough, shortness of breath.  Am I more at risk for COVID-19 since I am pregnant? There is not currently data showing that pregnant women are more adversely impacted by COVID-19 than the general population. However, we know that pregnant women tend to have worse respiratory complications from similar diseases such as the flu and SARS and for this reason should be considered an at-risk population.  What do I do if I am experiencing the symptoms of COVID-19? Testing is being limited because of test availability. If you are experiencing symptoms you should quarantine yourself, and the members of your family, for at least 2 weeks at home.   Please visit this website for more information: RunningShows.co.za.html  When should I go to the Emergency Room? Please go to the emergency room if you are experiencing ANY of these symptoms*:  1.    Difficulty breathing or shortness of breath 2.    Persistent  pain or pressure in the chest 3.    Confusion or difficulty being aroused (or awakened) 4.    Bluish lips or face  *This list is not all inclusive. Please consult our office for any other  symptoms that are severe or concerning.  What do I do if I am having difficulty breathing? You should go to the Emergency Room for evaluation. At this time they have a tent set up for evaluating patients with COVID-19 symptoms.   How will my prenatal care be different because of the COVID-19 pandemic? It has been recommended to reduce the frequency of face-to-face visits and use resources such as telephone and virtual visits when possible. Using a scale, blood pressure machine and fetal doppler at home can further help reduce face-to-face visits. You will be provided with additional information on this topic.  We ask that you come to your visits alone to minimize potential exposures to  COVID-19.  How can I receive childbirth education? At this time in-person classes have been cancelled. You can register for online childbirth education, breastfeeding, and newborn care classes.  Please visit:  CyberComps.hu for more information  How will my hospital birth experience be different? The hospital is currently limiting visitors. This means that while you are in labor you can only have one person at the hospital with you. Additional family members will not be allowed to wait in the building or outside your room. Your one support person can be the father of the baby, a relative, a doula, or a friend. Once one support person is designated that person will wear a band. This band cannot be shared with multiple people.  Nitrous Gas is not being offered for pain relief since the tubing and filter for the machine can not be sanitized in a way to guarantee prevention of transmission of COVID-19.  Nasal cannula use of oxygen for fetal indications has also been discontinued.  Currently a clear plastic sheet is being hung between mom and the delivering provider during pushing and delivery to help prevent transmission of COVID-19.      How long will I stay in the hospital for after giving birth? It  is also recommended that discharge home be expedited during the COVID-19 outbreak. This means staying for 1 day after a vaginal delivery and 2 days after a cesarean section. Patients who need to stay longer for medical reasons are allowed to do so, but the goal will be for expedited discharge home.   What if I have COVID-19 and I am in labor? We ask that you wear a mask while on labor and delivery. We will try and accommodate you being placed in a room that is capable of filtering the air. Please call ahead if you are in labor and on your way to the hospital. The phone number for labor and delivery at [facility] is [phone].  If I have COVID-19 when my baby is born how can I prevent my baby from contracting COVID-19? This is an issue that will have to be discussed on a case-by-case basis. Current recommendations suggest providing separate isolation rooms for both the mother and new infant as well as limiting visitors. However, there are practical challenges to this recommendation. The situation will assuredly change and decisions will be influenced by the desires of the mother and availability of space.  Some suggestions are the use of a curtain or physical barrier between mom and infant, hand hygiene, mom wearing a mask, or  6 feet of spacing between a mom and infant.   Can I breastfeed during the COVID-19 pandemic?   Yes, breastfeeding is encouraged.  Can I breastfeed if I have COVID-19? Yes. Covid-19 has not been found in breast milk. This means you cannot give COVID-19 to your child through breast milk. Breast feeding will also help pass antibodies to fight infection to your baby.   What precautions should I take when breastfeeding if I have COVID-19? If a mother and newborn do room-in and the mother wishes to feed at the breast, she should put on a facemask and practice hand hygiene before each feeding.  What precautions should I take when pumping if I have COVID-19?  Prior to expressing breast milk, mothers should practice hand hygiene. After each pumping session, all parts that come into contact with breast milk should be thoroughly washed and the entire pump should be appropriately disinfected per the manufacturer's instructions. This expressed breast milk should be fed to the newborn by a healthy caregiver.  What if I am pregnant and work in healthcare? Based on limited data regarding COVID-19 and pregnancy, ACOG currently does not propose creating additional restrictions on pregnant health care personnel because of COVID-19 alone. Pregnant women do not appear to be at higher risk of severe disease related to COVID-19. Pregnant health care personnel should follow CDC risk assessment and infection control guidelines for health care personnel exposed to patients with suspected or confirmed COVID-19. Adherence to recommended infection prevention and control practices is an important part of protecting all health care personnel in health care settings.    Information on COVID-19 in pregnancy is very limited; however, facilities may want to consider limiting exposure of pregnant health care personnel to patients with confirmed or suspected COVID-19 infection, especially during higher-risk procedures (eg, aerosol-generating procedures), if feasible, based on staffing availability.   Common Medications Safe in Pregnancy  Acne:      Constipation:  Benzoyl Peroxide     Colace  Clindamycin      Dulcolax Suppository  Topica Erythromycin     Fibercon  Salicylic Acid      Metamucil         Miralax AVOID:        Senakot   Accutane    Cough:  Retin-A       Cough Drops  Tetracycline      Phenergan w/ Codeine if Rx  Minocycline      Robitussin (Plain & DM)  Antibiotics:     Crabs/Lice:  Ceclor       RID  Cephalosporins    AVOID:  E-Mycins      Kwell  Keflex  Macrobid/Macrodantin   Diarrhea:  Penicillin      Kao-Pectate  Zithromax      Imodium AD         PUSH  FLUIDS AVOID:       Cipro     Fever:  Tetracycline      Tylenol (Regular or Extra  Minocycline       Strength)  Levaquin      Extra Strength-Do not          Exceed 8 tabs/24 hrs Caffeine:        <228m/day (equiv. To 1 cup of coffee or  approx. 3 12 oz sodas)         Gas: Cold/Hayfever:       Gas-X  Benadryl      Mylicon  Claritin  Phazyme  **Claritin-D        Chlor-Trimeton    Headaches:  Dimetapp      ASA-Free Excedrin  Drixoral-Non-Drowsy     Cold Compress  Mucinex (Guaifenasin)     Tylenol (Regular or Extra  Sudafed/Sudafed-12 Hour     Strength)  **Sudafed PE Pseudoephedrine   Tylenol Cold & Sinus     Vicks Vapor Rub  Zyrtec  **AVOID if Problems With Blood Pressure         Heartburn: Avoid lying down for at least 1 hour after meals  Aciphex      Maalox     Rash:  Milk of Magnesia     Benadryl    Mylanta       1% Hydrocortisone Cream  Pepcid  Pepcid Complete   Sleep Aids:  Prevacid      Ambien   Prilosec       Benadryl  Rolaids       Chamomile Tea  Tums (Limit 4/day)     Unisom  Zantac       Tylenol PM         Warm milk-add vanilla or  Hemorrhoids:       Sugar for taste  Anusol/Anusol H.C.  (RX: Analapram 2.5%)  Sugar Substitutes:  Hydrocortisone OTC     Ok in moderation  Preparation H      Tucks        Vaseline lotion applied to tissue with wiping    Herpes:     Throat:  Acyclovir      Oragel  Famvir  Valtrex     Vaccines:         Flu Shot Leg Cramps:       *Gardasil  Benadryl      Hepatitis A         Hepatitis B Nasal Spray:       Pneumovax  Saline Nasal Spray     Polio Booster         Tetanus Nausea:       Tuberculosis test or PPD  Vitamin B6 25 mg TID   AVOID:    Dramamine      *Gardasil  Emetrol       Live Poliovirus  Ginger Root 250 mg QID    MMR (measles, mumps &  High Complex Carbs @ Bedtime    rebella)  Sea Bands-Accupressure    Varicella (Chickenpox)  Unisom 1/2 tab TID     *No known complications           If received before  Pain:         Known pregnancy;   Darvocet       Resume series after  Lortab        Delivery  Percocet    Yeast:   Tramadol      Femstat  Tylenol 3      Gyne-lotrimin  Ultram       Monistat  Vicodin           MISC:         All Sunscreens           Hair Coloring/highlights          Insect Repellant's          (Including DEET)         Mystic Tans

## 2018-11-20 NOTE — Progress Notes (Signed)
NOB today. Pt has not had any prenatal care.

## 2018-11-22 LAB — RPR+RH+ABO+RUB AB+AB SCR+CB...
Antibody Screen: NEGATIVE
HIV Screen 4th Generation wRfx: NONREACTIVE
Hematocrit: 30.1 % — ABNORMAL LOW (ref 34.0–46.6)
Hemoglobin: 9.8 g/dL — ABNORMAL LOW (ref 11.1–15.9)
Hepatitis B Surface Ag: NEGATIVE
MCH: 26 pg — ABNORMAL LOW (ref 26.6–33.0)
MCHC: 32.6 g/dL (ref 31.5–35.7)
MCV: 80 fL (ref 79–97)
Platelets: 164 10*3/uL (ref 150–450)
RBC: 3.77 x10E6/uL (ref 3.77–5.28)
RDW: 15.3 % (ref 11.7–15.4)
RPR Ser Ql: NONREACTIVE
Rh Factor: POSITIVE
Rubella Antibodies, IGG: 1.47 index (ref >0.99)
Varicella zoster IgG: 2543 index (ref >165)
WBC: 7 10*3/uL (ref 3.4–10.8)

## 2018-11-22 LAB — URINE CULTURE: Organism ID, Bacteria: NO GROWTH

## 2018-11-22 LAB — HGB A1C W/O EAG: Hgb A1c MFr Bld: 5 % (ref 4.8–5.6)

## 2018-11-25 NOTE — Progress Notes (Addendum)
New Obstetric Patient H&P  Date of service: 11/20/2018  Chief Complaint: "Desires prenatal care"   History of Present Illness: Patient is a 28 y.o. G1P0 Not Hispanic or Latino female, presents with amenorrhea and positive home pregnancy test. No LMP recorded. Patient is pregnant. and based on early [facility] [place], her EDD is Estimated Date of Delivery: 01/18/19 and her EGA is [id number]. Patient does not remember when she was [facility] but thinks it may have been in January. Cycles are 5. days, regular, and occur approximately every : 28 days. Patient has never had a PAP smear. PAP today.   She had a urine pregnancy test which was positive in mid January. Her last menstrual period was normal and lasted for  5 day(s). Since her LMP she claims she has experienced breast tenderness, fatigue, nausea, vomiting. She denies vaginal bleeding. 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 Hepatitis C positive. This is her first pregnancy.  Since her LMP, she admits to the use of tobacco products  3 c/d She claims she has gained   21 pounds since the start of her pregnancy.  There are cats in the home in the home  no  She admits close contact with children on a regular basis  no  She has had chicken pox in the past yes She has had Tuberculosis exposures, symptoms, or previously tested positive for TB   no Current or past history of domestic violence. no  Genetic Screening/Teratology Counseling: (Includes patient, baby's father, or anyone in either family with:)   1. [facility]  no 2. Thalassemia ([place], [place], Mediterranean, or Asian background): MCV<80  no 3. Neural tube defect (meningomyelocele, spina bifida, anencephaly)  no 4. Congenital heart defect  no  5. Down syndrome  no 6. Tay-Sachs (Jewish, [place]  Canadian)  no 7. Canavan's Disease  no 8. Sickle cell disease or trait (African)  no  9. Hemophilia or other blood disorders  no  10. Muscular dystrophy  no  11. Cystic fibrosis  no  12. Huntington's Chorea  no  13. Mental retardation/autism  no 14. Other inherited genetic or chromosomal disorder  no 15. Maternal metabolic disorder (DM, PKU, etc)  no 16. Patient or FOB with a child with a birth defect not listed above no  16a. Patient or FOB with a birth defect themselves no 17. Recurrent pregnancy loss, or stillbirth  no  18. Any medications since LMP other than prenatal vitamins (include vitamins, supplements, OTC meds, drugs, alcohol)  no 19. Any other genetic/environmental exposure to discuss  no  Infection History:   1. Lives with someone with TB or TB exposed  no  2. Patient or partner has history of genital herpes  no 3. Rash or viral illness since LMP  no 4. History of STI (GC, CT, HPV, syphilis, HIV)  Hx of Chlamydia- treated, Hx Hepatitis C 5. History of recent travel :  no  Other pertinent information:  Recent move from [place] to be with boyfriend. Late entry to prenatal care. Methadone treatment at clinic in [place].    Review of Systems:10 point review of systems negative unless otherwise noted in HPI  Past Medical History:  Past Medical History:  Diagnosis Date  . Anemia   . Substance abuse [facility](HCC)     Past Surgical History:  Past Surgical History:  Procedure Laterality Date  . NO PAST SURGERIES      Gynecologic History: No LMP recorded. Patient is pregnant.  Obstetric History: G1P0  Family History:  Family History  Problem Relation Age of Onset  . Leukemia Mother     Social History:  Social History   Socioeconomic History  . Marital status: Single    Spouse name: Not on file  . Number of children: Not on file  . Years of education: Not on file  . Highest education level: Not on file  Occupational History  . Not on file  Social Needs  .  Financial resource strain: Not on file  . Food insecurity    Worry: Not on file    Inability: Not on file  . Transportation needs    Medical: Not on file    Non-medical: Not on file  Tobacco Use  . Smoking status: Current Every Day Smoker    Packs/day: 0.50    Types: Cigarettes  . Smokeless tobacco: Never Used  Substance and Sexual Activity  . Alcohol use: Not Currently  . Drug use: Not Currently  . Sexual activity: Yes    Birth control/protection: None  Lifestyle  . Physical activity    Days per week: Not on file    Minutes per session: Not on file  . Stress: Not on file  Relationships  . Social Herbalist on phone: Not on file    Gets together: Not on file    Attends religious service: Not on file    Active member of club or organization: Not on file    Attends meetings of clubs or organizations: Not on file    Relationship status: Not on file  . Intimate partner violence    Fear of current or ex partner: Not on file    Emotionally abused: Not on file    Physically abused: Not on file    Forced sexual activity: Not on file  Other Topics Concern  . Not on file  Social History Narrative  . Not on file    Allergies:  Allergies  Allergen Reactions  . Acetaminophen-Pamabrom Nausea Only    Medications: Prior to Admission medications   Medication Sig Start Date End Date Taking? Authorizing Provider  ferrous sulfate 325 (65 FE) MG tablet Take 325 mg by mouth daily with breakfast.   Yes [provider]  methadone (DOLOPHINE) 1 MG/1ML solution Take by mouth.   Yes [provider]  Prenatal Vit-Fe Fumarate-FA (MULTIVITAMIN-PRENATAL) 27-0.8 MG TABS tablet Take 1 tablet by mouth daily at 12 noon.   Yes [provider]    Physical Exam Vitals: Blood pressure 118/78, weight 161 lb (73 kg).  General: NAD HEENT: normocephalic, anicteric Thyroid: no enlargement, no palpable nodules Pulmonary: No increased work of breathing, CTAB  Cardiovascular: RRR, distal pulses 2+ Abdomen: NABS, soft, non-tender, non-distended.  Umbilicus without lesions.  No hepatomegaly, splenomegaly or masses palpable. No evidence of hernia. Fetal heart tones 130s.  Genitourinary:  External: Normal external female genitalia.  Normal urethral meatus, normal, Bartholin's and Skene's glands.    Vagina: Normal vaginal mucosa, no evidence of prolapse.    Cervix: Grossly normal in appearance, no bleeding, no CMT  Uterus: Enlarged, non-tender    Adnexa: deferred  Rectal: deferred Extremities: no edema, erythema, or tenderness Neurologic: Grossly intact Psychiatric: mood appropriate, affect full   Assessment: 28 y.o. G1P0 at [redacted]w[redacted]d presenting to initiate prenatal care  Plan: 1) Avoid  alcoholic beverages. 2) Patient encouraged not to smoke.  3) Discontinue the use of all non-medicinal drugs and chemicals.  4) Take prenatal vitamins daily.  5) Nutrition, food safety (fish, cheese advisories, and high nitrite foods) and exercise discussed. 6) Hospital and practice style discussed with cross coverage system.  7) Genetic Screening, such as with 1st Trimester Screening, cell free fetal DNA, AFP testing, and Ultrasound, as well as with amniocentesis and CVS as appropriate, is discussed with patient. At the conclusion of today's visit patient declined genetic testing 8) Patient is asked about travel to areas at risk for the [place]ika virus, and counseled to avoid travel and exposure to mosquitoes or sexual partners who may have themselves been exposed to the virus. Testing is discussed, and will be ordered as appropriate. 9) PAP and NOB labs today including Hgb A1C. 10) Return in 1 week for anatomy scan and rob 11) Referral to hepatology after pregnancy 12) Patient plans to breastfeed    Tresea MallJane Sammy Douthitt, CNM [facility] 11/25/2018, 8:59 AM

## 2018-11-26 LAB — CYTOLOGY - PAP
Chlamydia: NEGATIVE
Diagnosis: NEGATIVE
Neisseria Gonorrhea: NEGATIVE
Trichomonas: NEGATIVE

## 2018-11-27 ENCOUNTER — Other Ambulatory Visit: Payer: Self-pay

## 2018-11-27 ENCOUNTER — Ambulatory Visit (INDEPENDENT_AMBULATORY_CARE_PROVIDER_SITE_OTHER): Payer: Medicaid Other

## 2018-11-27 ENCOUNTER — Ambulatory Visit (INDEPENDENT_AMBULATORY_CARE_PROVIDER_SITE_OTHER): Payer: Medicaid Other | Admitting: Obstetrics and Gynecology

## 2018-11-27 ENCOUNTER — Encounter: Payer: Self-pay | Admitting: Obstetrics and Gynecology

## 2018-11-27 VITALS — BP 118/64 | Wt 158.0 lb

## 2018-11-27 DIAGNOSIS — Z23 Encounter for immunization: Secondary | ICD-10-CM | POA: Diagnosis not present

## 2018-11-27 DIAGNOSIS — O99320 Drug use complicating pregnancy, unspecified trimester: Secondary | ICD-10-CM | POA: Insufficient documentation

## 2018-11-27 DIAGNOSIS — F1120 Opioid dependence, uncomplicated: Secondary | ICD-10-CM | POA: Insufficient documentation

## 2018-11-27 DIAGNOSIS — O0993 Supervision of high risk pregnancy, unspecified, third trimester: Secondary | ICD-10-CM

## 2018-11-27 DIAGNOSIS — O99323 Drug use complicating pregnancy, third trimester: Secondary | ICD-10-CM

## 2018-11-27 DIAGNOSIS — O0990 Supervision of high risk pregnancy, unspecified, unspecified trimester: Secondary | ICD-10-CM

## 2018-11-27 DIAGNOSIS — O99013 Anemia complicating pregnancy, third trimester: Secondary | ICD-10-CM

## 2018-11-27 DIAGNOSIS — Z3A32 32 weeks gestation of pregnancy: Secondary | ICD-10-CM

## 2018-11-27 DIAGNOSIS — O99011 Anemia complicating pregnancy, first trimester: Secondary | ICD-10-CM | POA: Insufficient documentation

## 2018-11-27 DIAGNOSIS — Z363 Encounter for antenatal screening for malformations: Secondary | ICD-10-CM | POA: Diagnosis not present

## 2018-11-27 DIAGNOSIS — O99019 Anemia complicating pregnancy, unspecified trimester: Secondary | ICD-10-CM | POA: Insufficient documentation

## 2018-11-27 NOTE — Addendum Note (Signed)
Addended by: Raechel Chute on: 11/27/2018 01:30 PM   Modules accepted: Orders

## 2018-11-27 NOTE — Progress Notes (Signed)
    Routine Prenatal Care Visit  Subjective  Margaret Krueger is a 28 y.o. G1P0 at [redacted]w[redacted]d being seen today for ongoing prenatal care.  She is currently monitored for the following issues for this high-risk pregnancy and has Supervision of high risk pregnancy, antepartum; Methadone maintenance treatment affecting pregnancy, antepartum ([place]); and Anemia during pregnancy in first trimester on their problem list.  ----------------------------------------------------------------------------------- Patient reports no complaints.   Contractions: Not present. Vag. Bleeding: None.  Movement: Present. Denies leaking of fluid.  ----------------------------------------------------------------------------------- The following portions of the patient's history were reviewed and updated as appropriate: allergies, current medications, past family history, past medical history, past social history, past surgical history and problem list. Problem list updated.   Objective  Blood pressure 118/64, weight 158 lb (71.7 kg). Pregravid weight 140 lb (63.5 kg) Total Weight Gain 18 lb (8.165 kg) Urinalysis:      Fetal Status: Fetal Heart Rate (bpm): 135   Movement: Present  Presentation: Vertex  General:  Alert, oriented and cooperative. Patient is in no acute distress.  Skin: Skin is warm and dry. No rash noted.   Cardiovascular: Normal heart rate noted  Respiratory: Normal respiratory effort, no problems with respiration noted  Abdomen: Soft, gravid, appropriate for gestational age. Pain/Pressure: Absent     Pelvic:  Cervical exam deferred        Extremities: Normal range of motion.  Edema: None  Mental Status: Normal mood and affect. Normal behavior. Normal judgment and thought content.     Assessment   28 y.o. G1P0 at [redacted]w[redacted]d by  01/18/2019, Date entered prior to episode creation presenting for routine prenatal visit  Plan   pregnancy Problems (from 11/20/18 to present)    Problem Noted Resolved   Supervision of high risk pregnancy, antepartum 11/20/2018 by Rod Can, CNM No   Overview Addendum 11/27/2018 12:35 PM by Homero Fellers, MD    [place] Prenatal Labs  Dating By early u/s Blood type: AB+  Genetic Screen 1 Screen:    AFP:     Quad:     NIPS: Antibody: negative  Anatomic [place]  incomplete Rubella: Immune, Varicella: Immune  GTT Early:               Third trimester:  RPR: Non-reactive   Rhogam  not needed HBsAg: Negative  TDaP vaccine  11/27/2018  Flu Shot: HIV: Non-reactive   Baby Food                                GBS:   Contraception  Pap: 11/20/18  CBB     CS/VBAC    Support Person [name]              Gestational age appropriate obstetric precautions including but not limited to vaginal bleeding, contractions, leaking of fluid and fetal movement were reviewed in detail with the patient.    1GTT ordered TDAP today Will refer to hematology for anemia. Oral iron has made her constipated in the past.  Declines anemia panel today.   Return in about 1 week (around 12/04/2018) for [place], 1 GTT and ROB.  Homero Fellers MD [facility] 11/27/2018, 12:35 PM

## 2018-11-27 NOTE — Progress Notes (Signed)
ROB No concerns Denies lof, no vb and Goof FM

## 2018-11-29 NOTE — Progress Notes (Signed)
[facility]  [phone] [phone]  ID: Margaret Friendlyiffany Krueger OB: Sep 18, 1990  MR#: [id number]  AOZ#:[id number]#:[id number]  Patient Care Team: Patient, No Pcp Per as PCP - General (General Practice)  CHIEF COMPLAINT:  Anemia during pregnancy  INTERVAL HISTORY: Patient is a 28 year old female in her third trimester pregnancy who was noted to have a declining hemoglobin on routine blood work.  She currently feels well and is asymptomatic.  She is gaining weight appropriately.  She has no neurologic complaints.  She denies any recent fevers or illnesses.  She has no chest pain, shortness of breath, cough, or hemoptysis.  She denies any nausea, vomiting, constipation, or diarrhea.  She has no melena or hematochezia.  She has no urinary complaints.  Patient offers no specific complaints today.  REVIEW OF SYSTEMS:   Review of Systems  Constitutional: Negative.  Negative for fever, malaise/fatigue and weight loss.  Respiratory: Negative.  Negative for cough, hemoptysis and shortness of breath.   Cardiovascular: Negative.  Negative for chest pain and leg swelling.  Gastrointestinal: Negative.  Negative for abdominal pain, blood in stool and melena.  Genitourinary: Negative.  Negative for hematuria.  Musculoskeletal: Negative.  Negative for back pain.  Skin: Negative.  Negative for rash.  Neurological: Negative.  Negative for dizziness, focal weakness, weakness and headaches.  Psychiatric/Behavioral: Negative.  The patient is not nervous/anxious.     As per HPI. Otherwise, a complete review of systems is negative.  PAST MEDICAL HISTORY: Past Medical History:  Diagnosis Date  . Anemia   . Substance abuse (HCC)     PAST SURGICAL HISTORY: Past Surgical History:  Procedure Laterality Date  . NO PAST SURGERIES      FAMILY HISTORY: Family History  Problem Relation Age of Onset  . Leukemia Mother     ADVANCED DIRECTIVES (Y/N):  N  HEALTH MAINTENANCE: Social  History   Tobacco Use  . Smoking status: Current Every Day Smoker    Packs/day: 0.50    Types: Cigarettes  . Smokeless tobacco: Never Used  Substance Use Topics  . Alcohol use: Not Currently  . Drug use: Not Currently     Colonoscopy:  PAP:  Bone density:  Lipid panel:  Allergies  Allergen Reactions  . Acetaminophen-Pamabrom Nausea Only    Current Outpatient Medications  Medication Sig Dispense Refill  . ferrous sulfate 325 (65 FE) MG tablet Take 325 mg by mouth daily with breakfast.    . methadone (DOLOPHINE) 10 MG/ML solution Take 90 mg by mouth daily.    . Prenatal Vit-Fe Fumarate-FA (MULTIVITAMIN-PRENATAL) 27-0.8 MG TABS tablet Take 1 tablet by mouth daily at 12 noon.     No current facility-administered medications for this visit.     OBJECTIVE: Vitals:   12/04/18 1509  BP: 119/75  Pulse: 96  Resp: 18  Temp: (!) 97.2 F (36.2 C)     Body mass index is 28.47 kg/m.    ECOG FS:0 - Asymptomatic  General: Well-developed, well-nourished, no acute distress. Eyes: Pink conjunctiva, anicteric sclera. HEENT: Normocephalic, moist mucous membranes, clear oropharnyx. Lungs: Clear to auscultation bilaterally. Heart: Regular rate and rhythm. No rubs, murmurs, or gallops. Abdomen: Appears appropriate for gestational age. Musculoskeletal: No edema, cyanosis, or clubbing. Neuro: Alert, answering all questions appropriately. Cranial nerves grossly intact. Skin: No rashes or petechiae noted. Psych: Normal affect. Lymphatics: No cervical, calvicular, axillary or inguinal LAD.   LAB RESULTS:  No results found for: NA, K, CL, CO2, GLUCOSE, BUN, CREATININE, CALCIUM, PROT, ALBUMIN, AST,  ALT, ALKPHOS, BILITOT, GFRNONAA, GFRAA  Lab Results  Component Value Date   WBC 9.3 12/04/2018   NEUTROABS 6.7 12/04/2018   HGB 10.2 (L) 12/04/2018   HCT 31.5 (L) 12/04/2018   MCV 81.4 12/04/2018   PLT 174 12/04/2018   Lab Results  Component Value Date   IRON 220 (H) 12/04/2018    TIBC 582 (H) 12/04/2018   IRONPCTSAT 38 (H) 12/04/2018   Lab Results  Component Value Date   FERRITIN 5 (L) 12/04/2018     STUDIES: US Ob Comp + 14 Wk  Result Date: 11/27/2018 Patient Name: Margaret Krueger DOB: Sep 26, 1990 MRN: [id number] ULTRASOUND REPORT Location: [facility] Date of Service: 11/27/2018 Indications:Anatomy Ultrasound Findings: Margaret Krueger intrauterine pregnancy is visualized with FHR at 134 BPM. Biometrics give an (U/S) Gestational age of [redacted]w[redacted]d and an (U/S) EDD of 01/25/2019; this correlates with the clinically established Estimated Date of Delivery: 01/18/19 Fetal presentation is Cephalic. EFW: 1818 g  4 lb 0 oz ). Placenta: anterior. Grade: 0 AFI: subjectively normal. Anatomic survey is incomplete for the fetal nose/lips and fetal profile. Gender - female.  Impression: 1. [redacted]w[redacted]d Viable Singleton Intrauterine pregnancy by U/S. 2. (U/S) EDD is consistent with Clinically established Estimated Date of Delivery: 01/18/19 . 3. Normal Anatomy Scan Recommendations: 1.Clinical correlation with the patient's History and Physical Exam. 2. Follow up in 1-2 weeks for repeat US to complete anatomy Gweneth Dimitri, RT Views are limited from advance gestation. Anatomy [place]  Can not exclude birth defects or genetic abnormalities. I have reviewed this ultrasound and the report. I agree with the above assessment and plan. [facility] 11/27/18 12:15 PM     ASSESSMENT: Anemia during pregnancy  PLAN:    1.  Anemia during pregnancy: Patient's hemoglobin and iron stores are significantly reduced.  B12 and folate levels are within normal limits.  Hemoglobinopathy profile was ordered for completeness and is pending at time of dictation.  Patient will benefit from IV iron.  Return to clinic in 1 and 2 weeks to receive 510 mg IV Feraheme.  Patient will then return to clinic weekly September 21 for further evaluation, repeat laboratory, and consideration of  additional IV iron. 2.  Pregnancy: Patient reports her due date is in early October, but thinks her OB/GYN may want to induce her early.  I spent a total of 45 minutes face-to-face with the patient of which greater than 50% of the visit was spent in counseling and coordination of care as detailed above.  Patient expressed understanding and was in agreement with this plan. She also understands that She can call clinic at any time with any questions, concerns, or complaints.    Lloyd Huger, MD   12/05/2018 6:41 AM

## 2018-12-03 ENCOUNTER — Other Ambulatory Visit: Payer: Self-pay

## 2018-12-04 ENCOUNTER — Other Ambulatory Visit: Payer: Self-pay

## 2018-12-04 ENCOUNTER — Inpatient Hospital Stay: Payer: Medicaid Other

## 2018-12-04 ENCOUNTER — Inpatient Hospital Stay: Payer: Medicaid Other | Attending: Oncology | Admitting: Oncology

## 2018-12-04 ENCOUNTER — Encounter: Payer: Self-pay | Admitting: Oncology

## 2018-12-04 VITALS — BP 119/75 | HR 96 | Temp 97.2°F | Resp 18 | Wt 160.7 lb

## 2018-12-04 DIAGNOSIS — F17210 Nicotine dependence, cigarettes, uncomplicated: Secondary | ICD-10-CM | POA: Diagnosis not present

## 2018-12-04 DIAGNOSIS — D649 Anemia, unspecified: Secondary | ICD-10-CM | POA: Diagnosis not present

## 2018-12-04 DIAGNOSIS — O99011 Anemia complicating pregnancy, first trimester: Secondary | ICD-10-CM

## 2018-12-04 DIAGNOSIS — Z3A31 31 weeks gestation of pregnancy: Secondary | ICD-10-CM | POA: Diagnosis not present

## 2018-12-04 DIAGNOSIS — O99013 Anemia complicating pregnancy, third trimester: Secondary | ICD-10-CM | POA: Insufficient documentation

## 2018-12-04 DIAGNOSIS — Z79899 Other long term (current) drug therapy: Secondary | ICD-10-CM | POA: Insufficient documentation

## 2018-12-04 LAB — CBC WITH DIFFERENTIAL/PLATELET
Abs Immature Granulocytes: 0.03 10*3/uL (ref 0.00–0.07)
Basophils Absolute: 0 10*3/uL (ref 0.0–0.1)
Basophils Relative: 0 %
Eosinophils Absolute: 0.1 10*3/uL (ref 0.0–0.5)
Eosinophils Relative: 1 %
HCT: 31.5 % — ABNORMAL LOW (ref 36.0–46.0)
Hemoglobin: 10.2 g/dL — ABNORMAL LOW (ref 12.0–15.0)
Immature Granulocytes: 0 %
Lymphocytes Relative: 21 %
Lymphs Abs: 2 10*3/uL (ref 0.7–4.0)
MCH: 26.4 pg (ref 26.0–34.0)
MCHC: 32.4 g/dL (ref 30.0–36.0)
MCV: 81.4 fL (ref 80.0–100.0)
Monocytes Absolute: 0.5 10*3/uL (ref 0.1–1.0)
Monocytes Relative: 5 %
Neutro Abs: 6.7 10*3/uL (ref 1.7–7.7)
Neutrophils Relative %: 73 %
Platelets: 174 10*3/uL (ref 150–400)
RBC: 3.87 MIL/uL (ref 3.87–5.11)
RDW: 16.9 % — ABNORMAL HIGH (ref 11.5–15.5)
WBC: 9.3 10*3/uL (ref 4.0–10.5)
nRBC: 0 % (ref 0.0–0.2)

## 2018-12-04 LAB — FOLATE: Folate: 37 ng/mL (ref >5.9)

## 2018-12-04 LAB — IRON AND TIBC
Iron: 220 ug/dL — ABNORMAL HIGH (ref 28–170)
Saturation Ratios: 38 % — ABNORMAL HIGH (ref 10.4–31.8)
TIBC: 582 ug/dL — ABNORMAL HIGH (ref 250–450)
UIBC: 362 ug/dL

## 2018-12-04 LAB — FERRITIN: Ferritin: 5 ng/mL — ABNORMAL LOW (ref 11–307)

## 2018-12-04 LAB — VITAMIN B12: Vitamin B-12: 218 pg/mL (ref 180–914)

## 2018-12-05 LAB — HEMOGLOBINOPATHY EVALUATION
Hgb A2 Quant: 2.5 % (ref 1.8–3.2)
Hgb A: 97.5 % (ref 96.4–98.8)
Hgb C: 0 %
Hgb F Quant: 0 % (ref 0.0–2.0)
Hgb S Quant: 0 %
Hgb Variant: 0 %

## 2018-12-08 ENCOUNTER — Ambulatory Visit (INDEPENDENT_AMBULATORY_CARE_PROVIDER_SITE_OTHER): Payer: Medicaid Other | Admitting: Maternal Newborn

## 2018-12-08 ENCOUNTER — Ambulatory Visit (INDEPENDENT_AMBULATORY_CARE_PROVIDER_SITE_OTHER): Payer: Medicaid Other

## 2018-12-08 ENCOUNTER — Encounter: Payer: Self-pay | Admitting: Maternal Newborn

## 2018-12-08 ENCOUNTER — Other Ambulatory Visit: Payer: Self-pay

## 2018-12-08 ENCOUNTER — Other Ambulatory Visit: Payer: Medicaid Other

## 2018-12-08 VITALS — BP 100/60 | Wt 161.0 lb

## 2018-12-08 DIAGNOSIS — O26893 Other specified pregnancy related conditions, third trimester: Secondary | ICD-10-CM

## 2018-12-08 DIAGNOSIS — O0990 Supervision of high risk pregnancy, unspecified, unspecified trimester: Secondary | ICD-10-CM

## 2018-12-08 DIAGNOSIS — Z362 Encounter for other antenatal screening follow-up: Secondary | ICD-10-CM

## 2018-12-08 DIAGNOSIS — O99013 Anemia complicating pregnancy, third trimester: Secondary | ICD-10-CM

## 2018-12-08 DIAGNOSIS — Z3A34 34 weeks gestation of pregnancy: Secondary | ICD-10-CM

## 2018-12-08 DIAGNOSIS — R109 Unspecified abdominal pain: Secondary | ICD-10-CM

## 2018-12-08 NOTE — Progress Notes (Signed)
    Routine Prenatal Care Visit  Subjective  Margaret Krueger is a 28 y.o. G1P0 at [redacted]w[redacted]d being seen today for ongoing prenatal care.  She is currently monitored for the following issues for this high-risk pregnancy and has Supervision of high risk pregnancy, antepartum; Methadone maintenance treatment affecting pregnancy, antepartum ([place]); and Anemia during pregnancy in first trimester on their problem list.  ----------------------------------------------------------------------------------- Patient reports some abdominal pains when baby is in certain positions; resolves on its own. Also has concerns about umbilical hernia.  Contractions: Not present. Vag. Bleeding: None.  Movement: Present. No leaking of fluid.  ----------------------------------------------------------------------------------- The following portions of the patient's history were reviewed and updated as appropriate: allergies, current medications, past family history, past medical history, past social history, past surgical history and problem list. Problem list updated.   Objective  Blood pressure 100/60, weight 161 lb (73 kg). Pregravid weight 140 lb (63.5 kg) Total Weight Gain 21 lb (9.526 kg) Urinalysis: Voided prior to ultrasound  Fetal Status: Fetal Heart Rate (bpm): 136 ([place])   Movement: Present  Presentation: Vertex  General:  Alert, oriented and cooperative. Patient is in no acute distress.  Skin: Skin is warm and dry. No rash noted.   Cardiovascular: Normal heart rate noted  Respiratory: Normal respiratory effort, no problems with respiration noted  Abdomen: Soft, gravid, appropriate for gestational age. Pain/Pressure: Absent     Pelvic:  Cervical exam deferred        Extremities: Normal range of motion.  Edema: None  Mental Status: Normal mood and affect. Normal behavior. Normal judgment and thought content.   Umbilical hernia present below umbilicus, approximately 3.5 cm, easily reduces.  Assessment   28  y.o. G1P0 at [redacted]w[redacted]d, EDD 01/18/2019 Date entered prior to episode creation presenting for a routine prenatal visit.  Plan   pregnancy Problems (from 11/20/18 to present)    Problem Noted Resolved   Supervision of high risk pregnancy, antepartum 11/20/2018 by Rod Can, CNM No   Overview Addendum 11/27/2018 12:36 PM by Homero Fellers, MD    Clinic [facility] Prenatal Labs  Dating By early u/s Blood type: AB+  Genetic Screen 1 Screen:    AFP:     Quad:     NIPS: Antibody: negative  Anatomic [place]  incomplete Rubella: Immune, Varicella: Immune  GTT Early:               Third trimester:  RPR: Non-reactive   Rhogam  not needed HBsAg: Negative  TDaP vaccine  11/27/2018  Flu Shot: HIV: Non-reactive   Baby Food                                GBS:   Contraception Interested in pill Pap: 11/20/18  CBB     CS/VBAC    Support Person [name]           Anatomy scan follow up for nose/lips and fetal profile, complete today, FHR 149 bpm, cephalic presentation. Results reviewed with patient.  Discussed reporting signs of incarcerated hernia such as intense pain or discoloration.  Please refer to After Visit Summary for other counseling recommendations.   Return in about 2 weeks (around 12/22/2018) for [place].  Avel Sensor, CNM 12/08/2018  3:12 PM

## 2018-12-08 NOTE — Patient Instructions (Signed)
Third Trimester of Pregnancy The third trimester is from week 28 through week 40 (months 7 through 9). The third trimester is a time when the unborn baby (fetus) is growing rapidly. At the end of the ninth month, the fetus is about 20 inches in length and weighs 6-10 pounds. Body changes during your third trimester Your body will continue to go through many changes during pregnancy. The changes vary from woman to woman. During the third trimester:  Your weight will continue to increase. You can expect to gain 25-35 pounds (11-16 kg) by the end of the pregnancy.  You may begin to get stretch marks on your hips, abdomen, and breasts.  You may urinate more often because the fetus is moving lower into your pelvis and pressing on your bladder.  You may develop or continue to have heartburn. This is caused by increased hormones that slow down muscles in the digestive tract.  You may develop or continue to have constipation because increased hormones slow digestion and cause the muscles that push waste through your intestines to relax.  You may develop hemorrhoids. These are swollen veins (varicose veins) in the rectum that can itch or be painful.  You may develop swollen, bulging veins (varicose veins) in your legs.  You may have increased body aches in the pelvis, back, or thighs. This is due to weight gain and increased hormones that are relaxing your joints.  You may have changes in your hair. These can include thickening of your hair, rapid growth, and changes in texture. Some women also have hair loss during or after pregnancy, or hair that feels dry or thin. Your hair will most likely return to normal after your baby is born.  Your breasts will continue to grow and they will continue to become tender. A yellow fluid (colostrum) may leak from your breasts. This is the first milk you are producing for your baby.  Your belly button may stick out.  You may notice more swelling in your hands,  face, or ankles.  You may have increased tingling or numbness in your hands, arms, and legs. The skin on your belly may also feel numb.  You may feel short of breath because of your expanding uterus.  You may have more problems sleeping. This can be caused by the size of your belly, increased need to urinate, and an increase in your body's metabolism.  You may notice the fetus "dropping," or moving lower in your abdomen (lightening).  You may have increased vaginal discharge.  You may notice your joints feel loose and you may have pain around your pelvic bone. What to expect at prenatal visits You will have prenatal exams every 2 weeks until week 36. Then you will have weekly prenatal exams. During a routine prenatal visit:  You will be weighed to make sure you and the baby are growing normally.  Your blood pressure will be taken.  Your abdomen will be measured to track your baby's growth.  The fetal heartbeat will be listened to.  Any test results from the previous visit will be discussed.  You may have a cervical check near your due date to see if your cervix has softened or thinned (effaced).  You will be tested for Group B streptococcus. This happens between 35 and 37 weeks. Your health care provider may ask you:  What your birth plan is.  How you are feeling.  If you are feeling the baby move.  If you have had any abnormal   symptoms, such as leaking fluid, bleeding, severe headaches, or abdominal cramping.  If you are using any tobacco products, including cigarettes, chewing tobacco, and electronic cigarettes.  If you have any questions. Other tests or screenings that may be performed during your third trimester include:  Blood tests that check for low iron levels (anemia).  Fetal testing to check the health, activity level, and growth of the fetus. Testing is done if you have certain medical conditions or if there are problems during the pregnancy.  Nonstress test  (NST). This test checks the health of your baby to make sure there are no signs of problems, such as the baby not getting enough oxygen. During this test, a belt is placed around your belly. The baby is made to move, and its heart rate is monitored during movement. What is false labor? False labor is a condition in which you feel small, irregular tightenings of the muscles in the womb (contractions) that usually go away with rest, changing position, or drinking water. These are called Braxton Hicks contractions. Contractions may last for hours, days, or even weeks before true labor sets in. If contractions come at regular intervals, become more frequent, increase in intensity, or become painful, you should see your health care provider. What are the signs of labor?  Abdominal cramps.  Regular contractions that start at 10 minutes apart and become stronger and more frequent with time.  Contractions that start on the top of the uterus and spread down to the lower abdomen and back.  Increased pelvic pressure and dull back pain.  A watery or bloody mucus discharge that comes from the vagina.  Leaking of amniotic fluid. This is also known as your "water breaking." It could be a slow trickle or a gush. Let your health care provider know if it has a color or strange odor. If you have any of these signs, call your health care provider right away, even if it is before your due date. Follow these instructions at home: Medicines  Follow your health care provider's instructions regarding medicine use. Specific medicines may be either safe or unsafe to take during pregnancy.  Take a prenatal vitamin that contains at least 600 micrograms (mcg) of folic acid.  If you develop constipation, try taking a stool softener if your health care provider approves. Eating and drinking   Eat a balanced diet that includes fresh fruits and vegetables, whole grains, good sources of protein such as meat, eggs, or tofu,  and low-fat dairy. Your health care provider will help you determine the amount of weight gain that is right for you.  Avoid raw meat and uncooked cheese. These carry germs that can cause birth defects in the baby.  If you have low calcium intake from food, talk to your health care provider about whether you should take a daily calcium supplement.  Eat four or five small meals rather than three large meals a day.  Limit foods that are high in fat and processed sugars, such as fried and sweet foods.  To prevent constipation: ? Drink enough fluid to keep your urine clear or pale yellow. ? Eat foods that are high in fiber, such as fresh fruits and vegetables, whole grains, and beans. Activity  Exercise only as directed by your health care provider. Most women can continue their usual exercise routine during pregnancy. Try to exercise for 30 minutes at least 5 days a week. Stop exercising if you experience uterine contractions.  Avoid heavy lifting.  Do   not exercise in extreme heat or humidity, or at high altitudes.  Wear low-heel, comfortable shoes.  Practice good posture.  You may continue to have sex unless your health care provider tells you otherwise. Relieving pain and discomfort  Take frequent breaks and rest with your legs elevated if you have leg cramps or low back pain.  Take warm sitz baths to soothe any pain or discomfort caused by hemorrhoids. Use hemorrhoid cream if your health care provider approves.  Wear a good support bra to prevent discomfort from breast tenderness.  If you develop varicose veins: ? Wear support pantyhose or compression stockings as told by your healthcare provider. ? Elevate your feet for 15 minutes, 3-4 times a day. Prenatal care  Write down your questions. Take them to your prenatal visits.  Keep all your prenatal visits as told by your health care provider. This is important. Safety  Wear your seat belt at all times when driving.  Make  a list of emergency phone numbers, including numbers for family, friends, the hospital, and police and fire departments. General instructions  Avoid cat litter boxes and soil used by cats. These carry germs that can cause birth defects in the baby. If you have a cat, ask someone to clean the litter box for you.  Do not travel far distances unless it is absolutely necessary and only with the approval of your health care provider.  Do not use hot tubs, steam rooms, or saunas.  Do not drink alcohol.  Do not use any products that contain nicotine or tobacco, such as cigarettes and e-cigarettes. If you need help quitting, ask your health care provider.  Do not use any medicinal herbs or unprescribed drugs. These chemicals affect the formation and growth of the baby.  Do not douche or use tampons or scented sanitary pads.  Do not cross your legs for long periods of time.  To prepare for the arrival of your baby: ? Take prenatal classes to understand, practice, and ask questions about labor and delivery. ? Make a trial run to the hospital. ? Visit the hospital and tour the maternity area. ? Arrange for maternity or paternity leave through employers. ? Arrange for family and friends to take care of pets while you are in the hospital. ? Purchase a rear-facing car seat and make sure you know how to install it in your car. ? Pack your hospital bag. ? Prepare the baby's nursery. Make sure to remove all pillows and stuffed animals from the baby's crib to prevent suffocation.  Visit your dentist if you have not gone during your pregnancy. Use a soft toothbrush to brush your teeth and be gentle when you floss. Contact a health care provider if:  You are unsure if you are in labor or if your water has broken.  You become dizzy.  You have mild pelvic cramps, pelvic pressure, or nagging pain in your abdominal area.  You have lower back pain.  You have persistent nausea, vomiting, or diarrhea.   You have an unusual or bad smelling vaginal discharge.  You have pain when you urinate. Get help right away if:  Your water breaks before 37 weeks.  You have regular contractions less than 5 minutes apart before 37 weeks.  You have a fever.  You are leaking fluid from your vagina.  You have spotting or bleeding from your vagina.  You have severe abdominal pain or cramping.  You have rapid weight loss or weight gain.  You have   shortness of breath with chest pain.  You notice sudden or extreme swelling of your face, hands, ankles, feet, or legs.  Your baby makes fewer than 10 movements in 2 hours.  You have severe headaches that do not go away when you take medicine.  You have vision changes. Summary  The third trimester is from week 28 through week 40, months 7 through 9. The third trimester is a time when the unborn baby (fetus) is growing rapidly.  During the third trimester, your discomfort may increase as you and your baby continue to gain weight. You may have abdominal, leg, and back pain, sleeping problems, and an increased need to urinate.  During the third trimester your breasts will keep growing and they will continue to become tender. A yellow fluid (colostrum) may leak from your breasts. This is the first milk you are producing for your baby.  False labor is a condition in which you feel small, irregular tightenings of the muscles in the womb (contractions) that eventually go away. These are called Braxton Hicks contractions. Contractions may last for hours, days, or even weeks before true labor sets in.  Signs of labor can include: abdominal cramps; regular contractions that start at 10 minutes apart and become stronger and more frequent with time; watery or bloody mucus discharge that comes from the vagina; increased pelvic pressure and dull back pain; and leaking of amniotic fluid. This information is not intended to replace advice given to you by your health  care provider. Make sure you discuss any questions you have with your health care provider. Document Released: 04/03/2001 Document Revised: 07/31/2018 Document Reviewed: 05/15/2016 Elsevier Patient Education  2020 [employer]  

## 2018-12-09 LAB — GLUCOSE TOLERANCE, 1 HOUR: Glucose, 1Hr PP: 102 mg/dL (ref 65–199)

## 2018-12-09 LAB — IRON AND TIBC
Iron Saturation: 23 % (ref 15–55)
Iron: 110 ug/dL (ref 27–159)
Total Iron Binding Capacity: 479 ug/dL — ABNORMAL HIGH (ref 250–450)
UIBC: 369 ug/dL (ref 131–425)

## 2018-12-09 LAB — VITAMIN B12: Vitamin B-12: 341 pg/mL (ref 232–1245)

## 2018-12-09 LAB — FERRITIN: Ferritin: 11 ng/mL — ABNORMAL LOW (ref 15–150)

## 2018-12-11 ENCOUNTER — Other Ambulatory Visit: Payer: Self-pay

## 2018-12-11 ENCOUNTER — Inpatient Hospital Stay: Payer: Medicaid Other

## 2018-12-11 VITALS — BP 106/75 | HR 86 | Temp 97.8°F | Resp 18

## 2018-12-11 DIAGNOSIS — O99013 Anemia complicating pregnancy, third trimester: Secondary | ICD-10-CM | POA: Diagnosis not present

## 2018-12-11 DIAGNOSIS — O99011 Anemia complicating pregnancy, first trimester: Secondary | ICD-10-CM

## 2018-12-11 MED ORDER — SODIUM CHLORIDE 0.9 % IV SOLN
510.0000 mg | Freq: Once | INTRAVENOUS | Status: AC
  Administered 2018-12-11: 510 mg via INTRAVENOUS
  Filled 2018-12-11: qty 17

## 2018-12-11 MED ORDER — SODIUM CHLORIDE 0.9 % IV SOLN
Freq: Once | INTRAVENOUS | Status: AC
  Administered 2018-12-11: 11:00:00 via INTRAVENOUS
  Filled 2018-12-11: qty 250

## 2018-12-18 ENCOUNTER — Inpatient Hospital Stay: Payer: Medicaid Other

## 2018-12-18 ENCOUNTER — Other Ambulatory Visit: Payer: Self-pay

## 2018-12-18 VITALS — BP 104/68 | HR 85 | Temp 98.0°F | Resp 18

## 2018-12-18 DIAGNOSIS — O99011 Anemia complicating pregnancy, first trimester: Secondary | ICD-10-CM

## 2018-12-18 DIAGNOSIS — O99013 Anemia complicating pregnancy, third trimester: Secondary | ICD-10-CM | POA: Diagnosis not present

## 2018-12-18 MED ORDER — SODIUM CHLORIDE 0.9 % IV SOLN
510.0000 mg | Freq: Once | INTRAVENOUS | Status: AC
  Administered 2018-12-18: 11:00:00 510 mg via INTRAVENOUS
  Filled 2018-12-18: qty 17

## 2018-12-18 MED ORDER — SODIUM CHLORIDE 0.9 % IV SOLN
Freq: Once | INTRAVENOUS | Status: AC
  Administered 2018-12-18: 11:00:00 via INTRAVENOUS
  Filled 2018-12-18: qty 250

## 2018-12-19 ENCOUNTER — Telehealth: Payer: Self-pay

## 2018-12-19 ENCOUNTER — Other Ambulatory Visit: Payer: Self-pay | Admitting: Maternal Newborn

## 2018-12-19 DIAGNOSIS — O219 Vomiting of pregnancy, unspecified: Secondary | ICD-10-CM

## 2018-12-19 MED ORDER — ONDANSETRON 4 MG PO TBDP: 4.0000 mg | ORAL_TABLET | Freq: Four times a day (QID) | ORAL | 0 refills | Status: DC | PRN

## 2018-12-19 NOTE — Telephone Encounter (Signed)
Pt aware.

## 2018-12-19 NOTE — Progress Notes (Signed)
Rx for Zofran sent to pharmacy.

## 2018-12-19 NOTE — Telephone Encounter (Signed)
Pt called triage line today reporting N&V. She would like for you to call something in. Its been going on since yesterday. [facility] in [place]

## 2018-12-19 NOTE — Telephone Encounter (Signed)
Sent Zofran to her pharmacy. Thanks.

## 2018-12-22 ENCOUNTER — Encounter: Payer: Self-pay | Admitting: Obstetrics & Gynecology

## 2018-12-22 ENCOUNTER — Ambulatory Visit (INDEPENDENT_AMBULATORY_CARE_PROVIDER_SITE_OTHER): Payer: Medicaid Other | Admitting: Obstetrics & Gynecology

## 2018-12-22 ENCOUNTER — Other Ambulatory Visit: Payer: Self-pay

## 2018-12-22 VITALS — BP 120/80 | Wt 162.0 lb

## 2018-12-22 DIAGNOSIS — O0990 Supervision of high risk pregnancy, unspecified, unspecified trimester: Secondary | ICD-10-CM

## 2018-12-22 DIAGNOSIS — Z3685 Encounter for antenatal screening for Streptococcus B: Secondary | ICD-10-CM

## 2018-12-22 DIAGNOSIS — O0993 Supervision of high risk pregnancy, unspecified, third trimester: Secondary | ICD-10-CM

## 2018-12-22 DIAGNOSIS — O99323 Drug use complicating pregnancy, third trimester: Secondary | ICD-10-CM

## 2018-12-22 DIAGNOSIS — Z3A36 36 weeks gestation of pregnancy: Secondary | ICD-10-CM

## 2018-12-22 DIAGNOSIS — F1120 Opioid dependence, uncomplicated: Secondary | ICD-10-CM

## 2018-12-22 NOTE — Patient Instructions (Signed)
Group B Streptococcus Infection During Pregnancy  Group B Streptococcus (GBS) is a type of bacteria (Streptococcus agalactiae) that is often found in healthy people, commonly in the rectum, vagina, and intestines. In people who are healthy and not pregnant, the bacteria rarely cause serious illness or complications. However, women who test positive for GBS during pregnancy can pass the bacteria to their baby during childbirth, which can cause serious infection in the baby after birth. Women with GBS may also have infections during their pregnancy or immediately after childbirth, such as urinary tract infections (UTIs) or infections of the uterus (uterine infections). Having GBS also increases a woman's risk of complications during pregnancy, such as early (preterm) labor or delivery, miscarriage, or stillbirth. Routine testing (screening) for GBS is recommended for all pregnant women. What increases the risk? You may have a higher risk for GBS infection during pregnancy if you had one during a past pregnancy. What are the signs or symptoms? In most cases, GBS infection does not cause symptoms in pregnant women. Signs and symptoms of a possible GBS-related infection may include:  Labor starting before the 37th week of pregnancy.  A UTI or bladder infection, which may cause: ? Fever. ? Pain or burning during urination. ? Frequent urination.  Fever during labor, along with: ? Bad-smelling discharge. ? Uterine tenderness. ? Rapid heartbeat in the mother, baby, or both. Rare but serious symptoms of a possible GBS-related infection in women include:  Blood infection (septicemia). This may cause fever, chills, or confusion.  Lung infection (pneumonia). This may cause fever, chills, cough, rapid breathing, difficulty breathing, or chest pain.  Bone, joint, skin, or soft tissue infection. How is this diagnosed? You may be screened for GBS between week 35 and week 37 of your pregnancy. If you have  symptoms of preterm labor, you may be screened earlier. This condition is diagnosed based on lab test results from:  A swab of fluid from the vagina and rectum.  A urine sample. How is this treated? This condition is treated with antibiotic medicine. When you go into labor, or as soon as your water breaks (your membranes rupture), you will be given antibiotics through an IV tube. Antibiotics will continue until after you give birth. If you are having a cesarean delivery, you do not need antibiotics unless your membranes have already ruptured. Follow these instructions at home:  Take over-the-counter and prescription medicines only as told by your health care provider.  Take your antibiotic medicine as told by your health care provider. Do not stop taking the antibiotic even if you start to feel better.  Keep all pre-birth (prenatal) visits and follow-up visits as told by your health care provider. This is important. Contact a health care provider if:  You have pain or burning when you urinate.  You have to urinate frequently.  You have a fever or chills.  You develop a bad-smelling vaginal discharge. Get help right away if:  Your membranes rupture.  You go into labor.  You have severe pain in your abdomen.  You have difficulty breathing.  You have chest pain. This information is not intended to replace advice given to you by your health care provider. Make sure you discuss any questions you have with your health care provider. Document Released: 07/17/2007 Document Revised: 07/31/2018 Document Reviewed: 11/03/2015 Elsevier Patient Education  2020 Elsevier Inc.  

## 2018-12-22 NOTE — Progress Notes (Signed)
  Subjective  Fetal Movement? yes Contractions? no Leaking Fluid? no Vaginal Bleeding? no  Objective  BP 120/80   Wt 162 lb (73.5 kg)   BMI 28.70 kg/m  General: NAD Pumonary: no increased work of breathing Abdomen: gravid, non-tender Extremities: no edema Psychiatric: mood appropriate, affect full Cx 2/70/-3 Assessment  28 y.o. G1P0 at [redacted]w[redacted]d by  01/18/2019, Date entered prior to episode creation presenting for routine prenatal visit  Plan   Problem List Items Addressed This Visit      Other   Supervision of high risk pregnancy, antepartum   Methadone maintenance treatment affecting pregnancy, antepartum ([place])    Other Visit Diagnoses    [redacted] weeks gestation of pregnancy    -  Primary   Antenatal screening for streptococcus B       Relevant Orders   Culture, beta strep (group b only)      pregnancy Problems (from 11/20/18 to present)    Problem Noted Resolved   Supervision of high risk pregnancy, antepartum 11/20/2018 by Rod Can, CNM No   Overview Addendum 12/08/2018  3:15 PM by Rexene Agent, [place] Prenatal Labs  Dating By early u/s Blood type: AB+  Genetic Screen 1 Screen:    AFP:     Quad:     NIPS: Antibody: negative  Anatomic [place] Complete 8/17 Rubella: Immune, Varicella: Immune  GTT Early:               Third trimester:  RPR: Non-reactive   Rhogam  not needed HBsAg: Negative  TDaP vaccine  11/27/2018  Flu Shot: HIV: Non-reactive   Baby Food Breast                               GBS: 8/31  Contraception Interested in pill Pap: 11/20/18  CBB  no   CS/VBAC n/a   Support Person Elonda Husky, MD, [facility], [facility] 12/22/2018  4:45 PM

## 2018-12-24 ENCOUNTER — Ambulatory Visit: Payer: Medicaid Other | Admitting: Internal Medicine

## 2018-12-26 LAB — CULTURE, BETA STREP (GROUP B ONLY): Strep Gp B Culture: NEGATIVE

## 2018-12-26 NOTE — Progress Notes (Signed)
[facility]  [phone] [phone]  ID: Margaret Friendlyiffany Krueger OB: 1991-01-15  MR#: [id number]  QIO#:[id number]#:[id number]  Patient Care Team: Patient, No Pcp Per as PCP - General (General Practice)  CHIEF COMPLAINT:  Anemia during pregnancy  INTERVAL HISTORY: Patient returns to clinic today for further evaluation and consideration of additional Feraheme.  She continues to have increasing weakness and fatigue secondary to pregnancy, but otherwise feels well.  She is gaining weight appropriately.  She has no neurologic complaints.  She denies any recent fevers or illnesses.  She has no chest pain, shortness of breath, cough, or hemoptysis.  She denies any nausea, vomiting, constipation, or diarrhea.  She has no melena or hematochezia.  She has no urinary complaints.  Patient offers no further specific complaints today.  REVIEW OF SYSTEMS:   Review of Systems  Constitutional: Positive for malaise/fatigue. Negative for fever and weight loss.  Respiratory: Negative.  Negative for cough, hemoptysis and shortness of breath.   Cardiovascular: Negative.  Negative for chest pain and leg swelling.  Gastrointestinal: Negative.  Negative for abdominal pain, blood in stool and melena.  Genitourinary: Negative.  Negative for hematuria.  Musculoskeletal: Negative.  Negative for back pain.  Skin: Negative.  Negative for rash.  Neurological: Positive for weakness. Negative for dizziness, focal weakness and headaches.  Psychiatric/Behavioral: Negative.  The patient is not nervous/anxious.     As per HPI. Otherwise, a complete review of systems is negative.  PAST MEDICAL HISTORY: Past Medical History:  Diagnosis Date  . Anemia   . Substance abuse (HCC)     PAST SURGICAL HISTORY: Past Surgical History:  Procedure Laterality Date  . NO PAST SURGERIES      FAMILY HISTORY: Family History  Problem Relation Age of Onset  . Leukemia Mother     ADVANCED DIRECTIVES (Y/N):  N   HEALTH MAINTENANCE: Social History   Tobacco Use  . Smoking status: Current Every Day Smoker    Packs/day: 0.50    Types: Cigarettes  . Smokeless tobacco: Never Used  Substance Use Topics  . Alcohol use: Not Currently  . Drug use: Not Currently     Colonoscopy:  PAP:  Bone density:  Lipid panel:  Allergies  Allergen Reactions  . Acetaminophen-Pamabrom Nausea Only    Current Outpatient Medications  Medication Sig Dispense Refill  . ferrous sulfate 325 (65 FE) MG tablet Take 325 mg by mouth daily with breakfast.    . methadone (DOLOPHINE) 10 MG/ML solution Take 90 mg by mouth daily.    . ondansetron (ZOFRAN ODT) 4 MG disintegrating tablet Take 1 tablet (4 mg total) by mouth every 6 (six) hours as needed for nausea. 20 tablet 0  . Prenatal Vit-Fe Fumarate-FA (MULTIVITAMIN-PRENATAL) 27-0.8 MG TABS tablet Take 1 tablet by mouth daily at 12 noon.     No current facility-administered medications for this visit.     OBJECTIVE: Vitals:   01/06/19 1150  BP: 115/70  Pulse: (!) 102  Resp: 16  Temp: (!) 95 F (35 C)     Body mass index is 28.7 kg/m.    ECOG FS:0 - Asymptomatic  General: Well-developed, well-nourished, no acute distress. Eyes: Pink conjunctiva, anicteric sclera. HEENT: Normocephalic, moist mucous membranes. Lungs: Clear to auscultation bilaterally. Heart: Regular rate and rhythm. No rubs, murmurs, or gallops. Abdomen: Appears appropriate for gestational age. Musculoskeletal: No edema, cyanosis, or clubbing. Neuro: Alert, answering all questions appropriately. Cranial nerves grossly intact. Skin: No rashes or petechiae noted. Psych: Normal affect.  LAB RESULTS:  No results found for: NA, K, CL, CO2, GLUCOSE, BUN, CREATININE, CALCIUM, PROT, ALBUMIN, AST, ALT, ALKPHOS, BILITOT, GFRNONAA, GFRAA  Lab Results  Component Value Date   WBC 7.4 01/06/2019   NEUTROABS 5.2 01/06/2019   HGB 10.5 (L) 01/06/2019   HCT 31.6 (L) 01/06/2019   MCV 84.3 01/06/2019    PLT 160 01/06/2019   Lab Results  Component Value Date   IRON 96 01/06/2019   TIBC 401 01/06/2019   IRONPCTSAT 24 01/06/2019   Lab Results  Component Value Date   FERRITIN 134 01/06/2019     STUDIES: US Ob Follow Up  Result Date: 12/08/2018 Patient Name: Margaret Krueger DOB: Oct 02, 1990 MRN: [id number] ULTRASOUND REPORT Location: [facility] Date of Service: 12/08/2018 Indications: Anatomy follow up ultrasound Findings: Margaret Krueger intrauterine pregnancy is visualized with FHR at 136 BPM. Fetal presentation is Cephalic. Placenta: Anterior . Grade: 1 to 2 AFI: subjectively normal. Anatomic survey is complete.  The nose/lips and fetal profile were seen today. There is no free peritoneal fluid in the cul de sac. Impression: 1. [redacted]w[redacted]d Viable Singleton Intrauterine pregnancy previously established criteria. 2. Normal Anatomy Scan is now complete Margaret Krueger, RT There is a singleton gestation with subjectively normal amniotic fluid volume. The fetal biometry correlates with established dating. Detailed evaluation of the fetal anatomy was performed.The fetal anatomical survey appears within normal limits within the resolution of ultrasound as described above.  Not all structures were able to be visualized on today's study.  It is recommended that a follow-up ultrasound be performed to complete visualization of the unobserved structures today.  It must be noted that a normal ultrasound is unable to rule out fetal aneuploidy nor is it able to detect all possible malformations.    The ultrasound images and findings were reviewed by me and I agree with the above report. Prentice Docker, MD, [facility], [facility] 12/08/2018 4:55 PM      ASSESSMENT: Anemia during pregnancy  PLAN:    1.  Anemia during pregnancy: Patient's hemoglobin has trended up and her iron stores are now within normal limits.  Previously, B12, folate, and hemoglobinopathy profile were all within normal  limits.  She does not require additional IV Feraheme today.  Return to clinic in 4 months, postpartum, for repeat laboratory work and further evaluation.  2.  Pregnancy: Patient reports her due date is in early October, but has an appointment with OB/GYN later today to possibly plan for a sooner induction.  Margaret Krueger  Patient expressed understanding and was in agreement with this plan. She also understands that She can call clinic at any time with any questions, concerns, or complaints.    Lloyd Huger, MD   01/07/2019 6:32 AM

## 2018-12-30 ENCOUNTER — Telehealth: Payer: Self-pay

## 2018-12-30 ENCOUNTER — Ambulatory Visit (INDEPENDENT_AMBULATORY_CARE_PROVIDER_SITE_OTHER): Payer: Medicaid Other | Admitting: Obstetrics and Gynecology

## 2018-12-30 ENCOUNTER — Encounter: Payer: Medicaid Other | Admitting: Advanced Practice Midwife

## 2018-12-30 ENCOUNTER — Other Ambulatory Visit: Payer: Self-pay

## 2018-12-30 ENCOUNTER — Encounter: Payer: Self-pay | Admitting: Obstetrics and Gynecology

## 2018-12-30 VITALS — BP 122/84 | Wt 162.0 lb

## 2018-12-30 DIAGNOSIS — O0990 Supervision of high risk pregnancy, unspecified, unspecified trimester: Secondary | ICD-10-CM

## 2018-12-30 DIAGNOSIS — O99013 Anemia complicating pregnancy, third trimester: Secondary | ICD-10-CM

## 2018-12-30 DIAGNOSIS — O99323 Drug use complicating pregnancy, third trimester: Secondary | ICD-10-CM

## 2018-12-30 DIAGNOSIS — O99011 Anemia complicating pregnancy, first trimester: Secondary | ICD-10-CM

## 2018-12-30 DIAGNOSIS — O0993 Supervision of high risk pregnancy, unspecified, third trimester: Secondary | ICD-10-CM

## 2018-12-30 DIAGNOSIS — B182 Chronic viral hepatitis C: Secondary | ICD-10-CM

## 2018-12-30 DIAGNOSIS — F1120 Opioid dependence, uncomplicated: Secondary | ICD-10-CM

## 2018-12-30 DIAGNOSIS — Z3A37 37 weeks gestation of pregnancy: Secondary | ICD-10-CM

## 2018-12-30 NOTE — Telephone Encounter (Signed)
Pt thinks she may be in labor; lot of pain in lower abd area.  [phone]  Pt has appt at 2:50 c SDJ.  Called pt to see what exactly is going on.  She states she is getting hard all over abd, it comes and goes, hasn't counted - she just got home; d/c is no diff than it's been.  Adv to count ctxs and I'll call her back to see how far apart they are.

## 2018-12-30 NOTE — Telephone Encounter (Signed)
Pt states she had ctxs 4-5 min apart that lasted 3-6 secs but in the last 36min has not had any ctxs.  Adv to keep appt at 2:50.  Pt will call if things change.

## 2018-12-30 NOTE — Progress Notes (Signed)
Routine Prenatal Care Visit  Subjective  Margaret Krueger is a 28 y.o. G1P0 at 4084w2d being seen today for ongoing prenatal care.  She is currently monitored for the following issues for this high-risk pregnancy and has Supervision of high risk pregnancy, antepartum; Methadone maintenance treatment affecting pregnancy, antepartum (HCC); Anemia during pregnancy in first trimester; and Chronic hepatitis C without hepatic coma (HCC) on their problem list.  ----------------------------------------------------------------------------------- Patient reports more intense ctx this AM, now subsided.   Contractions: Irritability. Vag. Bleeding: None.  Movement: Present. Leaking Fluid denies.  ----------------------------------------------------------------------------------- The following portions of the patient's history were reviewed and updated as appropriate: allergies, current medications, past family history, past medical history, past social history, past surgical history and problem list. Problem list updated.  Objective  Blood pressure 122/84, weight 162 lb (73.5 kg). Pregravid weight 140 lb (63.5 kg) Total Weight Gain 22 lb (9.979 kg) Urinalysis: Urine Protein    Urine Glucose    Fetal Status: Fetal Heart Rate (bpm): 140 Fundal Height: 36 cm Movement: Present  Presentation: Vertex  General:  Alert, oriented and cooperative. Patient is in no acute distress.  Skin: Skin is warm and dry. No rash noted.   Cardiovascular: Normal heart rate noted  Respiratory: Normal respiratory effort, no problems with respiration noted  Abdomen: Soft, gravid, appropriate for gestational age. Pain/Pressure: Present     Pelvic:  Cervical exam performed Dilation: 2 Effacement (%): 70 Station: -2  Extremities: Normal range of motion.  Edema: None  Mental Status: Normal mood and affect. Normal behavior. Normal judgment and thought content.   Assessment   28 y.o. G1P0 at 1684w2d by  01/18/2019, Date entered prior to  episode creation presenting for routine prenatal visit  Plan   pregnancy Problems (from 11/20/18 to present)    Problem Noted Resolved   Chronic hepatitis C without hepatic coma (HCC) 12/30/2018 by Conard NovakJackson, Sheniya Garciaperez D, MD No   Methadone maintenance treatment affecting pregnancy, antepartum (HCC) 11/27/2018 by Natale MilchSchuman, Christanna R, MD No   Anemia during pregnancy in first trimester 11/27/2018 by Natale MilchSchuman, Christanna R, MD No   Supervision of high risk pregnancy, antepartum 11/20/2018 by Tresea MallGledhill, Jane, CNM No   Overview Addendum 12/08/2018  3:15 PM by Oswaldo ConroySchmid, Jacelyn Y, CNM    Clinic [facility] Prenatal Labs  Dating By early u/s Blood type: AB+  Genetic Screen 1 Screen:    AFP:     Quad:     NIPS: Antibody: negative  Anatomic US Complete 8/17 Rubella: Immune, Varicella: Immune  GTT Early:               Third trimester:  RPR: Non-reactive   Rhogam  not needed HBsAg: Negative  TDaP vaccine  11/27/2018  Flu Shot: HIV: Non-reactive   Baby Food Breast                               GBS:   Contraception Interested in pill Pap: 11/20/18  CBB     CS/VBAC    Support Person Al [place]              Term labor symptoms and general obstetric precautions including but not limited to vaginal bleeding, contractions, leaking of fluid and fetal movement were reviewed in detail with the patient. Please refer to After Visit Summary for other counseling recommendations.   - Discussed NAS and hospital protocols for tx of infant. Discussed the newer protocol for reducing time in hospital. More information  available at hospital. - would like to breastfeed.   Return in about 1 week (around 01/06/2019) for Routine Prenatal Appointment.  Prentice Docker, MD, [facility], [facility] 12/30/2018 3:41 PM

## 2019-01-05 ENCOUNTER — Other Ambulatory Visit: Payer: Self-pay

## 2019-01-05 DIAGNOSIS — O99011 Anemia complicating pregnancy, first trimester: Secondary | ICD-10-CM

## 2019-01-06 ENCOUNTER — Encounter: Payer: Medicaid Other | Admitting: Obstetrics and Gynecology

## 2019-01-06 ENCOUNTER — Other Ambulatory Visit: Payer: Self-pay

## 2019-01-06 ENCOUNTER — Inpatient Hospital Stay: Payer: Medicaid Other

## 2019-01-06 ENCOUNTER — Inpatient Hospital Stay: Payer: Medicaid Other | Attending: Oncology

## 2019-01-06 ENCOUNTER — Inpatient Hospital Stay (HOSPITAL_BASED_OUTPATIENT_CLINIC_OR_DEPARTMENT_OTHER): Payer: Medicaid Other | Admitting: Oncology

## 2019-01-06 ENCOUNTER — Encounter: Payer: Self-pay | Admitting: Oncology

## 2019-01-06 VITALS — BP 115/70 | HR 102 | Temp 95.0°F | Resp 16 | Wt 162.0 lb

## 2019-01-06 DIAGNOSIS — D649 Anemia, unspecified: Secondary | ICD-10-CM | POA: Insufficient documentation

## 2019-01-06 DIAGNOSIS — F17210 Nicotine dependence, cigarettes, uncomplicated: Secondary | ICD-10-CM | POA: Insufficient documentation

## 2019-01-06 DIAGNOSIS — O99019 Anemia complicating pregnancy, unspecified trimester: Secondary | ICD-10-CM | POA: Insufficient documentation

## 2019-01-06 DIAGNOSIS — O99330 Smoking (tobacco) complicating pregnancy, unspecified trimester: Secondary | ICD-10-CM | POA: Insufficient documentation

## 2019-01-06 DIAGNOSIS — O99011 Anemia complicating pregnancy, first trimester: Secondary | ICD-10-CM

## 2019-01-06 LAB — CBC WITH DIFFERENTIAL/PLATELET
Abs Immature Granulocytes: 0.04 10*3/uL (ref 0.00–0.07)
Basophils Absolute: 0 10*3/uL (ref 0.0–0.1)
Basophils Relative: 0 %
Eosinophils Absolute: 0.1 10*3/uL (ref 0.0–0.5)
Eosinophils Relative: 1 %
HCT: 31.6 % — ABNORMAL LOW (ref 36.0–46.0)
Hemoglobin: 10.5 g/dL — ABNORMAL LOW (ref 12.0–15.0)
Immature Granulocytes: 1 %
Lymphocytes Relative: 22 %
Lymphs Abs: 1.6 10*3/uL (ref 0.7–4.0)
MCH: 28 pg (ref 26.0–34.0)
MCHC: 33.2 g/dL (ref 30.0–36.0)
MCV: 84.3 fL (ref 80.0–100.0)
Monocytes Absolute: 0.4 10*3/uL (ref 0.1–1.0)
Monocytes Relative: 6 %
Neutro Abs: 5.2 10*3/uL (ref 1.7–7.7)
Neutrophils Relative %: 70 %
Platelets: 160 10*3/uL (ref 150–400)
RBC: 3.75 MIL/uL — ABNORMAL LOW (ref 3.87–5.11)
RDW: 17.5 % — ABNORMAL HIGH (ref 11.5–15.5)
WBC: 7.4 10*3/uL (ref 4.0–10.5)
nRBC: 0 % (ref 0.0–0.2)

## 2019-01-06 LAB — FERRITIN: Ferritin: 134 ng/mL (ref 11–307)

## 2019-01-06 LAB — IRON AND TIBC
Iron: 96 ug/dL (ref 28–170)
Saturation Ratios: 24 % (ref 10.4–31.8)
TIBC: 401 ug/dL (ref 250–450)
UIBC: 305 ug/dL

## 2019-01-07 ENCOUNTER — Encounter: Payer: Self-pay | Admitting: Obstetrics & Gynecology

## 2019-01-07 ENCOUNTER — Ambulatory Visit (INDEPENDENT_AMBULATORY_CARE_PROVIDER_SITE_OTHER): Payer: Medicaid Other | Admitting: Obstetrics & Gynecology

## 2019-01-07 VITALS — BP 120/80 | Wt 162.0 lb

## 2019-01-07 DIAGNOSIS — O0990 Supervision of high risk pregnancy, unspecified, unspecified trimester: Secondary | ICD-10-CM

## 2019-01-07 DIAGNOSIS — O99013 Anemia complicating pregnancy, third trimester: Secondary | ICD-10-CM

## 2019-01-07 DIAGNOSIS — Z23 Encounter for immunization: Secondary | ICD-10-CM

## 2019-01-07 DIAGNOSIS — Z3A38 38 weeks gestation of pregnancy: Secondary | ICD-10-CM

## 2019-01-07 LAB — POCT URINALYSIS DIPSTICK OB
Glucose, UA: NEGATIVE
POC,PROTEIN,UA: NEGATIVE

## 2019-01-07 NOTE — Progress Notes (Signed)
  Subjective  Fetal Movement? yes Contractions? no Leaking Fluid? no Vaginal Bleeding? no  Objective  BP 120/80   Wt 162 lb (73.5 kg)   BMI 28.70 kg/m  General: NAD Pumonary: no increased work of breathing Abdomen: gravid, non-tender Extremities: no edema Psychiatric: mood appropriate, affect full  Assessment  28 y.o. G1P0 at [redacted]w[redacted]d by  01/18/2019, Date entered prior to episode creation presenting for routine prenatal visit  Plan   Problem List Items Addressed This Visit      Other   Supervision of high risk pregnancy, antepartum    Other Visit Diagnoses    [redacted] weeks gestation of pregnancy    -  Primary   Need for immunization against influenza       Relevant Orders   Flu Vaccine QUAD 36+ mos IM (Completed)      pregnancy Problems (from 11/20/18 to present)    Problem Noted Resolved   Chronic hepatitis C without hepatic coma ([place]) 12/30/2018 by Will Bonnet, MD No   Methadone maintenance treatment affecting pregnancy, antepartum ([place]) 11/27/2018 by Homero Fellers, MD No   Anemia during pregnancy in first trimester 11/27/2018 by Homero Fellers, MD No   Supervision of high risk pregnancy, antepartum 11/20/2018 by Rod Can, CNM No   Overview Addendum 12/08/2018  3:15 PM by Rexene Agent, [place] Prenatal Labs  Dating By early u/s Blood type: AB+  Genetic Screen 1 Screen:    AFP:     Quad:     NIPS: Antibody: negative  Anatomic [place] Complete 8/17 Rubella: Immune, Varicella: Immune  GTT Early:               Third trimester:  RPR: Non-reactive   Rhogam  not needed HBsAg: Negative  TDaP vaccine  11/27/2018  Flu Shot: HIV: Non-reactive   Baby Food Breast                               GBS: NEG  Contraception Interested in pill Pap: 11/20/18  CBB     CS/VBAC    Support Person [name]            Labor precautions and expectations discussed  [place]  Flu shot today  Barnett Applebaum, MD, [facility], [facility] 01/07/2019  1:57 PM

## 2019-01-07 NOTE — Addendum Note (Signed)
Addended by: Quintella Baton D on: 01/07/2019 03:30 PM   Modules accepted: Orders

## 2019-01-15 ENCOUNTER — Encounter: Payer: Medicaid Other | Admitting: Obstetrics & Gynecology

## 2019-01-15 ENCOUNTER — Other Ambulatory Visit: Payer: Self-pay

## 2019-01-15 ENCOUNTER — Ambulatory Visit (INDEPENDENT_AMBULATORY_CARE_PROVIDER_SITE_OTHER): Payer: Medicaid Other | Admitting: Obstetrics & Gynecology

## 2019-01-15 ENCOUNTER — Telehealth: Payer: Self-pay

## 2019-01-15 ENCOUNTER — Encounter: Payer: Self-pay | Admitting: Anesthesiology

## 2019-01-15 ENCOUNTER — Encounter: Payer: Self-pay | Admitting: Obstetrics & Gynecology

## 2019-01-15 ENCOUNTER — Inpatient Hospital Stay
Admission: EM | Admit: 2019-01-15 | Discharge: 2019-01-18 | DRG: 806 | Disposition: A | Discharge status: Home / Self Care | Payer: Medicaid Other | Attending: Obstetrics and Gynecology | Admitting: Obstetrics and Gynecology

## 2019-01-15 VITALS — BP 120/80 | Wt 161.0 lb

## 2019-01-15 DIAGNOSIS — O99323 Drug use complicating pregnancy, third trimester: Secondary | ICD-10-CM | POA: Diagnosis not present

## 2019-01-15 DIAGNOSIS — F1120 Opioid dependence, uncomplicated: Secondary | ICD-10-CM | POA: Diagnosis not present

## 2019-01-15 DIAGNOSIS — O0993 Supervision of high risk pregnancy, unspecified, third trimester: Secondary | ICD-10-CM | POA: Diagnosis not present

## 2019-01-15 DIAGNOSIS — O9081 Anemia of the puerperium: Secondary | ICD-10-CM | POA: Diagnosis not present

## 2019-01-15 DIAGNOSIS — B182 Chronic viral hepatitis C: Secondary | ICD-10-CM

## 2019-01-15 DIAGNOSIS — Z3A39 39 weeks gestation of pregnancy: Secondary | ICD-10-CM

## 2019-01-15 DIAGNOSIS — O9842 Viral hepatitis complicating childbirth: Secondary | ICD-10-CM | POA: Diagnosis present

## 2019-01-15 DIAGNOSIS — D62 Acute posthemorrhagic anemia: Secondary | ICD-10-CM | POA: Diagnosis not present

## 2019-01-15 DIAGNOSIS — Z20828 Contact with and (suspected) exposure to other viral communicable diseases: Secondary | ICD-10-CM | POA: Diagnosis present

## 2019-01-15 DIAGNOSIS — O26893 Other specified pregnancy related conditions, third trimester: Secondary | ICD-10-CM | POA: Diagnosis present

## 2019-01-15 DIAGNOSIS — O0990 Supervision of high risk pregnancy, unspecified, unspecified trimester: Secondary | ICD-10-CM

## 2019-01-15 DIAGNOSIS — F17210 Nicotine dependence, cigarettes, uncomplicated: Secondary | ICD-10-CM | POA: Diagnosis present

## 2019-01-15 DIAGNOSIS — O99320 Drug use complicating pregnancy, unspecified trimester: Secondary | ICD-10-CM

## 2019-01-15 DIAGNOSIS — O479 False labor, unspecified: Secondary | ICD-10-CM | POA: Diagnosis present

## 2019-01-15 DIAGNOSIS — O99334 Smoking (tobacco) complicating childbirth: Secondary | ICD-10-CM | POA: Diagnosis present

## 2019-01-15 DIAGNOSIS — O9902 Anemia complicating childbirth: Secondary | ICD-10-CM

## 2019-01-15 DIAGNOSIS — O99011 Anemia complicating pregnancy, first trimester: Secondary | ICD-10-CM

## 2019-01-15 LAB — URINE DRUG SCREEN, QUALITATIVE (ARMC ONLY)
Amphetamines, Ur Screen: NOT DETECTED
Barbiturates, Ur Screen: NOT DETECTED
Benzodiazepine, Ur Scrn: NOT DETECTED
Cannabinoid 50 Ng, Ur ~~LOC~~: NOT DETECTED
Cocaine Metabolite,Ur ~~LOC~~: NOT DETECTED
MDMA (Ecstasy)Ur Screen: NOT DETECTED
Methadone Scn, Ur: POSITIVE — AB
Opiate, Ur Screen: NOT DETECTED
Phencyclidine (PCP) Ur S: NOT DETECTED
Tricyclic, Ur Screen: NOT DETECTED

## 2019-01-15 LAB — CBC
HCT: 34.9 % — ABNORMAL LOW (ref 36.0–46.0)
Hemoglobin: 11.7 g/dL — ABNORMAL LOW (ref 12.0–15.0)
MCH: 28.1 pg (ref 26.0–34.0)
MCHC: 33.5 g/dL (ref 30.0–36.0)
MCV: 83.7 fL (ref 80.0–100.0)
Platelets: 179 10*3/uL (ref 150–400)
RBC: 4.17 MIL/uL (ref 3.87–5.11)
RDW: 16.9 % — ABNORMAL HIGH (ref 11.5–15.5)
WBC: 17 10*3/uL — ABNORMAL HIGH (ref 4.0–10.5)
nRBC: 0 % (ref 0.0–0.2)

## 2019-01-15 MED ORDER — LACTATED RINGERS IV SOLN: 500.0000 mL | Freq: Once | INTRAVENOUS | Status: DC

## 2019-01-15 MED ORDER — FENTANYL-BUPIVACAINE-NACL 0.5-0.125-0.9 MG/250ML-% EP SOLN: 12.0000 mL/h | EPIDURAL | Status: DC | PRN

## 2019-01-15 MED ORDER — AMMONIA AROMATIC IN INHA
RESPIRATORY_TRACT | Status: AC
  Filled 2019-01-15: qty 10

## 2019-01-15 MED ORDER — SOD CITRATE-CITRIC ACID 500-334 MG/5ML PO SOLN: 30.0000 mL | ORAL | Status: DC | PRN

## 2019-01-15 MED ORDER — LIDOCAINE HCL (PF) 1 % IJ SOLN: 30.0000 mL | INTRAMUSCULAR | Status: DC | PRN

## 2019-01-15 MED ORDER — OXYTOCIN 40 UNITS IN NORMAL SALINE INFUSION - SIMPLE MED: 2.5000 [IU]/h | INTRAVENOUS | Status: DC

## 2019-01-15 MED ORDER — MISOPROSTOL 200 MCG PO TABS
ORAL_TABLET | ORAL | Status: AC
  Filled 2019-01-15: qty 4

## 2019-01-15 MED ORDER — OXYTOCIN BOLUS FROM INFUSION
500.0000 mL | Freq: Once | INTRAVENOUS | Status: AC
  Administered 2019-01-16: 500 mL via INTRAVENOUS

## 2019-01-15 MED ORDER — LIDOCAINE HCL (PF) 1 % IJ SOLN
INTRAMUSCULAR | Status: AC
  Administered 2019-01-15
  Filled 2019-01-15: qty 30

## 2019-01-15 MED ORDER — METHADONE HCL 10 MG/ML PO CONC
90.0000 mg | Freq: Every day | ORAL | Status: DC
  Administered 2019-01-16 – 2019-01-18 (×3): 90 mg via ORAL
  Filled 2019-01-15 (×4): qty 9

## 2019-01-15 MED ORDER — EPHEDRINE 5 MG/ML INJ
10.0000 mg | INTRAVENOUS | Status: DC | PRN
  Filled 2019-01-15: qty 2

## 2019-01-15 MED ORDER — DIPHENHYDRAMINE HCL 50 MG/ML IJ SOLN: 12.5000 mg | INTRAMUSCULAR | Status: DC | PRN

## 2019-01-15 MED ORDER — OXYTOCIN 40 UNITS IN NORMAL SALINE INFUSION - SIMPLE MED
INTRAVENOUS | Status: AC
  Filled 2019-01-15: qty 1000

## 2019-01-15 MED ORDER — BUTORPHANOL TARTRATE 1 MG/ML IJ SOLN
1.0000 mg | INTRAMUSCULAR | Status: DC | PRN
  Administered 2019-01-16: 1 mg via INTRAVENOUS
  Filled 2019-01-15: qty 1

## 2019-01-15 MED ORDER — PHENYLEPHRINE 40 MCG/ML (10ML) SYRINGE FOR IV PUSH (FOR BLOOD PRESSURE SUPPORT)
80.0000 ug | PREFILLED_SYRINGE | INTRAVENOUS | Status: DC | PRN
  Filled 2019-01-15: qty 10

## 2019-01-15 MED ORDER — ONDANSETRON HCL 4 MG/2ML IJ SOLN: 4.0000 mg | Freq: Four times a day (QID) | INTRAMUSCULAR | Status: DC | PRN

## 2019-01-15 MED ORDER — FENTANYL 2.5 MCG/ML W/ROPIVACAINE 0.15% IN NS 100 ML EPIDURAL (ARMC)
EPIDURAL | Status: AC
  Filled 2019-01-15: qty 100

## 2019-01-15 MED ORDER — LACTATED RINGERS IV SOLN
INTRAVENOUS | Status: DC
  Administered 2019-01-15: 23:00:00 via INTRAVENOUS

## 2019-01-15 MED ORDER — OXYTOCIN 10 UNIT/ML IJ SOLN
INTRAMUSCULAR | Status: AC
  Filled 2019-01-15: qty 2

## 2019-01-15 MED ORDER — FENTANYL 2.5 MCG/ML W/ROPIVACAINE 0.15% IN NS 100 ML EPIDURAL (ARMC): 12.0000 mL/h | EPIDURAL | Status: DC | PRN

## 2019-01-15 MED ORDER — LACTATED RINGERS IV SOLN: 500.0000 mL | INTRAVENOUS | Status: DC | PRN

## 2019-01-15 NOTE — Progress Notes (Signed)
  [facility] IN[facility] ASSESSMENT SCHEDULING Margaret Krueger 1990-11-14 Medical record #: [id number] Phone #:  Home Phone [phone]  Mobile [phone]    Prenatal Provider:[facility]ivering Group[facility] Proposed admission date/time:9/30 at 0800 Method of induction:Pitocin  Weight: Filed Weights09/24/20 1140Weight:161 lb (73 kg) BMI Body mass index is 28.52 kg/m. HIV Negative HSV Negative EDC Estimated Date of Delivery: 9/27/20based on:LMP  Gestational age on admission: 4 Gravidity/parity:G1P0  Cervix Score   0 1 2 3   Position Posterior Midposition Anterior   Consistency Firm Medium Soft   Effacement (%) 0-30 40-50 60-70 >80  Dilation (cm) Closed 1-2 3-4 >5  Baby's station -3 -2 -1 +1, +2   Bishop Score:9  select indication(s) below Elective induction  ?39 weeks primiparous patient with Bishop score ?7 ?40 weeks primiparous patient    Provider Signature: Hoyt Koch Scheduled ZG:[id number] L Date:01/15/2019 12:13 PM   Call [phone] to finalize the induction date/time  [id number] (07/17)

## 2019-01-15 NOTE — H&P (Signed)
H&P  Margaret Krueger is an 28 y.o. female.  HPI: She presents today for labor evaluation. She had her membranes swept today in the office. She was 3 cm at that time. She reports loss of a mucus plug and small vaginal spotting. Good fetal movement.    Pregnancy is complicated by hepatitis C, methadone, anemia, and late prenatal care.   pregnancy Problems (from 11/20/18 to present)    Problem Noted Resolved   Chronic hepatitis C without hepatic coma ([place]) 12/30/2018 by Margaret Bonnet, MD No   Methadone maintenance treatment affecting pregnancy, antepartum ([place]) 11/27/2018 by Margaret Fellers, MD No   Anemia during pregnancy in first trimester 11/27/2018 by Margaret Fellers, MD No   Supervision of high risk pregnancy, antepartum 11/20/2018 by Margaret Krueger, CNM No   Overview Addendum 12/08/2018  3:15 PM by Margaret Krueger, Margaret Krueger Prenatal Labs  Dating By early u/s Blood type: AB+  Genetic Screen None Antibody: negative  Anatomic [place] Complete 8/17 Rubella: Immune, Varicella: Immune  GTT Third trimester: 102 RPR: Non-reactive   Rhogam  not needed HBsAg: Negative  TDaP vaccine  11/27/2018  Flu Shot: HIV: Non-reactive   Baby Food Breast                               GBS: negative  Contraception Interested in pill Pap: 11/20/18 NIL  CBB     CS/VBAC  N/A   Support Person Margaret Krueger              Past Medical History:  Diagnosis Date  . Anemia   . Substance abuse [facility])     Past Surgical History:  Procedure Laterality Date  . elbow sugery    . NO PAST SURGERIES      Family History  Problem Relation Age of Onset  . Leukemia Mother     Social History:  reports that she has been smoking cigarettes. She has been smoking about 0.50 packs per day. She has never used smokeless tobacco. She reports previous alcohol use. She reports previous drug use.  Allergies:  Allergies  Allergen Reactions  . Acetaminophen-Pamabrom Nausea Only    Medications: I have reviewed  the patient's current medications.  No results found for this or any previous visit (from the past 48 hour(s)).  No results found.  Review of Systems  Constitutional: Negative for chills, fever, malaise/fatigue and weight loss.  HENT: Negative for congestion, hearing loss and sinus pain.   Eyes: Negative for blurred vision and double vision.  Respiratory: Negative for cough, sputum production, shortness of breath and wheezing.   Cardiovascular: Negative for chest pain, palpitations, orthopnea and leg swelling.  Gastrointestinal: Negative for abdominal pain, constipation, diarrhea, nausea and vomiting.  Genitourinary: Negative for dysuria, flank pain, frequency, hematuria and urgency.  Musculoskeletal: Negative for back pain, falls and joint pain.  Skin: Negative for itching and rash.  Neurological: Negative for dizziness and headaches.  Psychiatric/Behavioral: Negative for depression, substance abuse and suicidal ideas. The patient is not nervous/anxious.    Blood pressure 131/83, pulse 89, temperature 98.4 F (36.9 C), temperature source Oral, resp. rate 16, height 5\' 3"  (1.6 m), weight 73 kg.   Physical Exam  Nursing note and vitals reviewed. Constitutional: She is oriented to person, place, and time. She appears well-developed and well-nourished.  HENT:  Head: Normocephalic and atraumatic.  Cardiovascular: Normal rate and regular rhythm.  Respiratory: Effort  normal and breath sounds normal.  GI: Soft. Bowel sounds are normal.  Musculoskeletal: Normal range of motion.  Neurological: She is alert and oriented to person, place, and time.  Skin: Skin is warm and dry.  Psychiatric: She has a normal mood and affect. Her behavior is normal. Judgment and thought content normal.   NST: 130 bpm baseline, moderate variability, 15x15 accelerations, no decelerations. Tocometer : q 2-3 min  SVE: 5/80/-2  Assessment/Plan: 28 yo G1P0 [redacted]w[redacted]d in labor 1. Expectant management of active  labor 2. GBS negative 3. Epidural when desired.  4. Continue methadone.  Margaret Krueger 01/15/2019, 10:02 PM

## 2019-01-15 NOTE — Telephone Encounter (Signed)
Pt calling triage today. RPH stripped her membranes today and she states she had some bleeding/mucous come out just now. Tried to call her back to let her know this is normal. Cannot get in touch with her. Please let me know if she calls back.

## 2019-01-15 NOTE — OB Triage Note (Signed)
Pt arrival to triage with c/o contractions starting around 1300. Pt denies LOF, and feels some fetal movement.  Some spotting noticed per pt. EFM and toco applied and assessing.

## 2019-01-15 NOTE — Progress Notes (Signed)
  Subjective  Fetal Movement? yes Contractions? no Leaking Fluid? no Vaginal Bleeding? no  Objective  BP 120/80   Wt 161 lb (73 kg)   BMI 28.52 kg/m  General: NAD Pumonary: no increased work of breathing Abdomen: gravid, non-tender Extremities: no edema Psychiatric: mood appropriate, affect full SVE 3/80/-2, membranes stripped Assessment  28 y.o. G1P0 at [redacted]w[redacted]d by  01/18/2019, Date entered prior to episode creation presenting for routine prenatal visit  Plan   Problem List Items Addressed This Visit      Other   Supervision of high risk pregnancy, antepartum   Methadone maintenance treatment affecting pregnancy, antepartum ([place])    Other Visit Diagnoses    [redacted] weeks gestation of pregnancy    -  Primary      pregnancy Problems (from 11/20/18 to present)    Problem Noted Resolved   Chronic hepatitis C without hepatic coma ([place]) 12/30/2018 by Will Bonnet, MD No   Methadone maintenance treatment affecting pregnancy, antepartum ([place]) 11/27/2018 by Homero Fellers, MD No   Anemia during pregnancy in first trimester 11/27/2018 by Homero Fellers, MD No   Supervision of high risk pregnancy, antepartum 11/20/2018 by Rod Can, CNM No   Overview Addendum 12/08/2018  3:15 PM by Rexene Agent, [place] Prenatal Labs  Dating By early u/s Blood type: AB+  Genetic Screen 1 Screen:    AFP:     Quad:     NIPS: Antibody: negative  Anatomic [place] Complete 8/17 Rubella: Immune, Varicella: Immune  GTT Early:               Third trimester:  RPR: Non-reactive   Rhogam  not needed HBsAg: Negative  TDaP vaccine  11/27/2018  Flu Shot: HIV: Non-reactive   Baby Food Breast                               GBS:   Contraception Interested in pill Pap: 11/20/18  CBB     CS/VBAC    Support Person [name]            IOL discussed     Sch for Wed 9/30 if still pregnant     F/U appt Mon prior to that for recheck  Labor precautions counseled  Methadone discussed.   Continue daily.  Barnett Applebaum, MD, [facility], [facility] 01/15/2019  12:11 PM

## 2019-01-15 NOTE — Patient Instructions (Signed)

## 2019-01-15 NOTE — Anesthesia Preprocedure Evaluation (Deleted)
Anesthesia Evaluation  Patient identified by MRN, date of birth, ID band Patient awake    Reviewed: Allergy & Precautions, H&P , NPO status , Patient's Chart, lab work & pertinent test results, reviewed documented beta blocker date and time   Airway Mallampati: II  TM Distance: >3 FB Neck ROM: full    Dental no notable dental hx. (+) Teeth Intact   Pulmonary neg pulmonary ROS, Current Smoker,    Pulmonary exam normal breath sounds clear to auscultation       Cardiovascular Exercise Tolerance: Good negative cardio ROS   Rhythm:regular Rate:Normal     Neuro/Psych negative neurological ROS  negative psych ROS   GI/Hepatic negative GI ROS, (+) Hepatitis -, C  Endo/Other  negative endocrine ROSdiabetes, Gestational  Renal/GU      Musculoskeletal   Abdominal   Peds  Hematology negative hematology ROS (+) Blood dyscrasia, anemia ,   Anesthesia Other Findings   Reproductive/Obstetrics (+) Pregnancy                            Anesthesia Physical Anesthesia Plan  ASA: III  Anesthesia Plan: Epidural   Post-op Pain Management:    Induction:   PONV Risk Score and Plan:   Airway Management Planned:   Additional Equipment:   Intra-op Plan:   Post-operative Plan:   Informed Consent: I have reviewed the patients History and Physical, chart, labs and discussed the procedure including the risks, benefits and alternatives for the proposed anesthesia with the patient or authorized representative who has indicated his/her understanding and acceptance.       Plan Discussed with:   Anesthesia Plan Comments:        Anesthesia Quick Evaluation

## 2019-01-16 LAB — TYPE AND SCREEN
ABO/RH(D): AB POS
Antibody Screen: NEGATIVE

## 2019-01-16 LAB — CBC
HCT: 31.1 % — ABNORMAL LOW (ref 36.0–46.0)
Hemoglobin: 10.3 g/dL — ABNORMAL LOW (ref 12.0–15.0)
MCH: 27.8 pg (ref 26.0–34.0)
MCHC: 33.1 g/dL (ref 30.0–36.0)
MCV: 84.1 fL (ref 80.0–100.0)
Platelets: 159 10*3/uL (ref 150–400)
RBC: 3.7 MIL/uL — ABNORMAL LOW (ref 3.87–5.11)
RDW: 16.8 % — ABNORMAL HIGH (ref 11.5–15.5)
WBC: 15.4 10*3/uL — ABNORMAL HIGH (ref 4.0–10.5)
nRBC: 0 % (ref 0.0–0.2)

## 2019-01-16 LAB — SARS CORONAVIRUS 2 BY RT PCR (HOSPITAL ORDER, PERFORMED IN ~~LOC~~ HOSPITAL LAB): SARS Coronavirus 2: NEGATIVE

## 2019-01-16 LAB — RPR: RPR Ser Ql: NONREACTIVE

## 2019-01-16 LAB — RAPID HIV SCREEN (HIV 1/2 AB+AG)
HIV 1/2 Antibodies: NONREACTIVE
HIV-1 P24 Antigen - HIV24: NONREACTIVE

## 2019-01-16 MED ORDER — FERROUS SULFATE 325 (65 FE) MG PO TABS
325.0000 mg | ORAL_TABLET | Freq: Two times a day (BID) | ORAL | Status: DC
  Administered 2019-01-16 – 2019-01-18 (×4): 325 mg via ORAL
  Filled 2019-01-16 (×4): qty 1

## 2019-01-16 MED ORDER — DIPHENHYDRAMINE HCL 25 MG PO CAPS: 25.0000 mg | ORAL_CAPSULE | Freq: Four times a day (QID) | ORAL | Status: DC | PRN

## 2019-01-16 MED ORDER — OXYCODONE HCL 5 MG PO TABS
5.0000 mg | ORAL_TABLET | ORAL | Status: DC | PRN
  Administered 2019-01-16: 5 mg via ORAL
  Filled 2019-01-16: qty 1

## 2019-01-16 MED ORDER — PRENATAL MULTIVITAMIN CH
1.0000 | ORAL_TABLET | Freq: Every day | ORAL | Status: DC
  Administered 2019-01-16 – 2019-01-17 (×2): 1 via ORAL
  Filled 2019-01-16 (×2): qty 1

## 2019-01-16 MED ORDER — COCONUT OIL OIL
1.0000 "application " | TOPICAL_OIL | Status: DC | PRN
  Filled 2019-01-16: qty 120

## 2019-01-16 MED ORDER — ZOLPIDEM TARTRATE 5 MG PO TABS: 5.0000 mg | ORAL_TABLET | Freq: Every evening | ORAL | Status: DC | PRN

## 2019-01-16 MED ORDER — SIMETHICONE 80 MG PO CHEW: 80.0000 mg | CHEWABLE_TABLET | ORAL | Status: DC | PRN

## 2019-01-16 MED ORDER — DIBUCAINE (PERIANAL) 1 % EX OINT
1.0000 "application " | TOPICAL_OINTMENT | CUTANEOUS | Status: DC | PRN
  Filled 2019-01-16: qty 28

## 2019-01-16 MED ORDER — WITCH HAZEL-GLYCERIN EX PADS
1.0000 "application " | MEDICATED_PAD | CUTANEOUS | Status: DC | PRN
  Filled 2019-01-16: qty 100

## 2019-01-16 MED ORDER — ONDANSETRON HCL 4 MG PO TABS: 4.0000 mg | ORAL_TABLET | ORAL | Status: DC | PRN

## 2019-01-16 MED ORDER — MAGNESIUM HYDROXIDE 400 MG/5ML PO SUSP
30.0000 mL | ORAL | Status: DC | PRN
  Filled 2019-01-16: qty 30

## 2019-01-16 MED ORDER — HYDROMORPHONE HCL 1 MG/ML IJ SOLN
INTRAMUSCULAR | Status: AC
  Administered 2019-01-16: 0.5 mg
  Filled 2019-01-16: qty 1

## 2019-01-16 MED ORDER — OXYCODONE HCL 5 MG PO TABS: 10.0000 mg | ORAL_TABLET | ORAL | Status: DC | PRN

## 2019-01-16 MED ORDER — BENZOCAINE-MENTHOL 20-0.5 % EX AERO
1.0000 "application " | INHALATION_SPRAY | CUTANEOUS | Status: DC | PRN
  Administered 2019-01-16: 1 via TOPICAL
  Filled 2019-01-16: qty 56

## 2019-01-16 MED ORDER — IBUPROFEN 600 MG PO TABS
ORAL_TABLET | ORAL | Status: AC
  Administered 2019-01-16: 15:00:00 600 mg via ORAL
  Filled 2019-01-16: qty 1

## 2019-01-16 MED ORDER — DOCUSATE SODIUM 100 MG PO CAPS
100.0000 mg | ORAL_CAPSULE | Freq: Two times a day (BID) | ORAL | Status: DC
  Administered 2019-01-16 – 2019-01-17 (×3): 100 mg via ORAL
  Filled 2019-01-16 (×3): qty 1

## 2019-01-16 MED ORDER — IBUPROFEN 600 MG PO TABS
600.0000 mg | ORAL_TABLET | Freq: Four times a day (QID) | ORAL | Status: DC
  Administered 2019-01-16 – 2019-01-18 (×9): 600 mg via ORAL
  Filled 2019-01-16 (×9): qty 1

## 2019-01-16 MED ORDER — ONDANSETRON HCL 4 MG/2ML IJ SOLN: 4.0000 mg | INTRAMUSCULAR | Status: DC | PRN

## 2019-01-16 NOTE — Lactation Note (Signed)
This note was copied from a baby's chart. Lactation Consultation Note  Patient Name: Margaret Krueger GYJEH'U Date: 01/16/2019 Reason for consult: Initial assessment;[place]  Mom has been attempting to latch baby to breast, I assisted after and baby was cueing but unable to latch deeply to inverted nipples, left nipple will evert but baby unable to stay on, right more difficult to evert, started on 20 mm shield, baby latched easily and nursed well with multiple swallows heard, set up [place] [employer] pump for pt to try later but mom wants to use nipple shield again at next feeding, given [employer] nipple shield info sheet to read. [place] referral faxed to [facility] for electric breast pump loan next week Maternal Data Formula Feeding for Exclusion: No Has patient been taught Hand Expression?: Yes Does the patient have breastfeeding experience prior to this delivery?: No  Feeding Feeding Type: Breast Fed  LATCH Score Latch: Grasps breast easily, tongue down, lips flanged, rhythmical sucking.  Audible Swallowing: Spontaneous and intermittent  Type of Nipple: Inverted  Comfort (Breast/Nipple): Soft / non-tender  Hold (Positioning): Assistance needed to correctly position infant at breast and maintain latch.  LATCH Score: 7  Interventions Interventions: Breast feeding basics reviewed;Assisted with latch;Skin to skin;Hand express;Adjust position;Support pillows  Lactation Tools Discussed/Used Tools: Nipple Shields Nipple shield size: 20 WIC Program: Yes   Consult Status Consult Status: Follow-up Date: 01/17/19 Follow-up type: In-patient    Margaret Krueger 01/16/2019, 5:33 PM

## 2019-01-16 NOTE — Progress Notes (Signed)
Subjective:  Patient reports feeling sore from delivery but overall is doing well. She is tolerating regular diet. Her pain is controlled with PO medications. She is ambulating and voiding without difficulty. She has decided to formula feed for now but may try to breastfeed again.   Objective:  Vital signs in last 24 hours: Temp:  [98.3 F (36.8 C)-98.6 F (37 C)] 98.3 F (36.8 C) (09/25 0847) Pulse Rate:  [74-157] 78 (09/25 0847) Resp:  [16-20] 18 (09/25 0847) BP: (103-144)/(63-87) 121/87 (09/25 0847) SpO2:  [98 %-99 %] 99 % (09/25 0402) Weight:  [73 kg] 73 kg (09/24 2028)    General: NAD Pulmonary: no increased work of breathing Abdomen: non-distended, non-tender, fundus firm at level of umbilicus Extremities: no edema, no erythema, no tenderness  Results for orders placed or performed during the hospital encounter of 01/15/19 (from the past 72 hour(s))  SARS Coronavirus 2 [facility] order, Performed in [facility] hospital lab) Nasopharyngeal Nasopharyngeal Swab     Status: None   Collection Time: 01/15/19 10:18 PM   Specimen: Nasopharyngeal Swab  Result Value Ref Range   SARS Coronavirus 2 NEGATIVE NEGATIVE    Comment: (NOTE) If result is NEGATIVE SARS-CoV-2 target nucleic acids are NOT DETECTED. The SARS-CoV-2 RNA is generally detectable in upper and lower  respiratory specimens during the acute phase of infection. The lowest  concentration of SARS-CoV-2 viral copies this assay can detect is 250  copies / mL. A negative result does not preclude SARS-CoV-2 infection  and should not be used as the sole basis for treatment or other  patient management decisions.  A negative result may occur with  improper specimen collection / handling, submission of specimen other  than nasopharyngeal swab, presence of viral mutation(s) within the  areas targeted by this assay, and inadequate number of viral copies  (<250 copies / mL). A negative result must be combined with clinical   observations, patient history, and epidemiological information. If result is POSITIVE SARS-CoV-2 target nucleic acids are DETECTED. The SARS-CoV-2 RNA is generally detectable in upper and lower  respiratory specimens dur ing the acute phase of infection.  Positive  results are indicative of active infection with SARS-CoV-2.  Clinical  correlation with patient history and other diagnostic information is  necessary to determine patient infection status.  Positive results do  not rule out bacterial infection or co-infection with other viruses. If result is PRESUMPTIVE POSTIVE SARS-CoV-2 nucleic acids MAY BE PRESENT.   A presumptive positive result was obtained on the submitted specimen  and confirmed on repeat testing.  While 2019 novel coronavirus  (SARS-CoV-2) nucleic acids may be present in the submitted sample  additional confirmatory testing may be necessary for epidemiological  and / or clinical management purposes  to differentiate between  SARS-CoV-2 and other Sarbecovirus currently known to infect humans.  If clinically indicated additional testing with an alternate test  methodology [phone]) is advised. The SARS-CoV-2 RNA is generally  detectable in upper and lower respiratory sp ecimens during the acute  phase of infection. The expected result is Negative. Fact Sheet for Patients:  BoilerBrush.com.cy Fact Sheet for Healthcare Providers: https://pope.com/ This test is not yet approved or cleared by the [place] FDA and has been authorized for detection and/or diagnosis of SARS-CoV-2 by FDA under an Emergency Use Authorization (EUA).  This EUA will remain in effect (meaning this test can be used) for the duration of the COVID-19 declaration under Section 564(b)(1) of the Act, 21 U.S.C. section 360bbb-3(b)(1),  unless the authorization is terminated or revoked sooner. Performed at [facility], [address]  [place], [place] [address]   CBC     Status: Abnormal   Collection Time: 01/15/19 10:42 PM  Result Value Ref Range   WBC 17.0 (H) 4.0 - 10.5 K/uL   RBC 4.17 3.87 - 5.11 MIL/uL   Hemoglobin 11.7 (L) 12.0 - 15.0 g/dL   HCT 60.434.9 (L) 54.036.0 - 98.146.0 %   MCV 83.7 80.0 - 100.0 fL   MCH 28.1 26.0 - 34.0 pg   MCHC 33.5 30.0 - 36.0 g/dL   RDW 19.116.9 (H) 47.811.5 - 29.515.5 %   Platelets 179 150 - 400 K/uL   nRBC 0.0 0.0 - 0.2 %    Comment: Performed at [facility], [address] [place], [place] [address]  Type and screen [facility]     Status: None   Collection Time: 01/15/19 10:42 PM  Result Value Ref Range   ABO/RH(D) AB POS    Antibody Screen NEG    Sample Expiration      01/18/2019,2359 Performed at [facility], [address] [place], [place] [address]   Rapid HIV screen (HIV 1/2 Ab+Ag) ([facility] Only)     Status: None   Collection Time: 01/15/19 10:42 PM  Result Value Ref Range   HIV-1 P24 Antigen - HIV24 NON REACTIVE NON REACTIVE    Comment: (NOTE) Detection of p24 may be inhibited by biotin in the sample, causing false negative results in acute infection.    HIV 1/2 Antibodies NON REACTIVE NON REACTIVE   Interpretation (HIV Ag Ab)      A non reactive test result means that HIV 1 or HIV 2 antibodies and HIV 1 p24 antigen were not detected in the specimen.    Comment: Performed at [facility], [name] [place], [place] [address]  Urine Drug Screen, Qualitative [facility] only)     Status: Abnormal   Collection Time: 01/15/19 11:10 PM  Result Value Ref Range   Tricyclic, Ur Screen NONE DETECTED NONE DETECTED   Amphetamines, Ur Screen NONE DETECTED NONE DETECTED   MDMA (Ecstasy)Ur Screen NONE DETECTED NONE DETECTED   Cocaine Metabolite,Ur [place] NONE DETECTED NONE DETECTED   Opiate, Ur Screen NONE DETECTED NONE DETECTED   Phencyclidine (PCP) Ur S NONE DETECTED NONE DETECTED   Cannabinoid 50 Ng, Ur [place] NONE DETECTED NONE DETECTED    Barbiturates, Ur Screen NONE DETECTED NONE DETECTED   Benzodiazepine, Ur Scrn NONE DETECTED NONE DETECTED   Methadone Scn, Ur POSITIVE (A) NONE DETECTED    Comment: (NOTE) Tricyclics + metabolites, urine    Cutoff 1000 ng/mL Amphetamines + metabolites, urine  Cutoff 1000 ng/mL MDMA (Ecstasy), urine              Cutoff 500 ng/mL Cocaine Metabolite, urine          Cutoff 300 ng/mL Opiate + metabolites, urine        Cutoff 300 ng/mL Phencyclidine (PCP), urine         Cutoff 25 ng/mL Cannabinoid, urine                 Cutoff 50 ng/mL Barbiturates + metabolites, urine  Cutoff 200 ng/mL Benzodiazepine, urine              Cutoff 200 ng/mL Methadone, urine                   Cutoff 300 ng/mL The  urine drug screen provides only a preliminary, unconfirmed analytical test result and should not be used for non-medical purposes. Clinical consideration and professional judgment should be applied to any positive drug screen result due to possible interfering substances. A more specific alternate chemical method must be used in order to obtain a confirmed analytical result. Gas chromatography / mass spectrometry (GC/MS) is the preferred confirmat ory method. Performed at [facility], [address] [place], [place] [address]   CBC     Status: Abnormal   Collection Time: 01/16/19  8:19 AM  Result Value Ref Range   WBC 15.4 (H) 4.0 - 10.5 K/uL   RBC 3.70 (L) 3.87 - 5.11 MIL/uL   Hemoglobin 10.3 (L) 12.0 - 15.0 g/dL   HCT 31.1 (L) 36.0 - 46.0 %   MCV 84.1 80.0 - 100.0 fL   MCH 27.8 26.0 - 34.0 pg   MCHC 33.1 30.0 - 36.0 g/dL   RDW 16.8 (H) 11.5 - 15.5 %   Platelets 159 150 - 400 K/uL   nRBC 0.0 0.0 - 0.2 %    Comment: Performed at [facility], [address] [place], [place] [address]    Assessment:   28 y.o. G1P1001 postpartum day # 0, 10 hours  Plan:    1) Acute blood loss anemia - hemodynamically stable and asymptomatic - po ferrous sulfate  2) Blood Type  --/--/AB POS (09/24 2242) / Rubella 1.47 (07/30 1447) / Varicella Immune  3) TDAP status up to date  4) Feeding plan formula  5)  Education given regarding options for contraception, as well as compatibility with breast feeding if applicable.  Patient plans on Depo-Provera injections for contraception.  6) Disposition: continue current care   Rod Can, [facility] 01/16/2019, 10:53 AM

## 2019-01-17 NOTE — Progress Notes (Signed)
Subjective:  Doing well, minimal lochia.  No fevers no chills.    Objective:  Vital signs in last 24 hours: Temp:  [98.2 F (36.8 C)-98.5 F (36.9 C)] 98.2 F (36.8 C) (09/26 0850) Pulse Rate:  [82-88] 88 (09/26 0850) Resp:  [17-18] 18 (09/26 0850) BP: (116-126)/(81-86) 126/83 (09/26 0850) SpO2:  [97 %-100 %] 100 % (09/26 0850)    General: NAD Pulmonary: no increased work of breathing Abdomen: non-distended, non-tender, fundus firm at level of umbilicus Extremities: no edema, no erythema, no tenderness  Results for orders placed or performed during the hospital encounter of 01/15/19 (from the past 72 hour(s))  SARS Coronavirus 2 [facility] order, Performed in [facility] hospital lab) Nasopharyngeal Nasopharyngeal Swab     Status: None   Collection Time: 01/15/19 10:18 PM   Specimen: Nasopharyngeal Swab  Result Value Ref Range   SARS Coronavirus 2 NEGATIVE NEGATIVE    Comment: (NOTE) If result is NEGATIVE SARS-CoV-2 target nucleic acids are NOT DETECTED. The SARS-CoV-2 RNA is generally detectable in upper and lower  respiratory specimens during the acute phase of infection. The lowest  concentration of SARS-CoV-2 viral copies this assay can detect is 250  copies / mL. A negative result does not preclude SARS-CoV-2 infection  and should not be used as the sole basis for treatment or other  patient management decisions.  A negative result may occur with  improper specimen collection / handling, submission of specimen other  than nasopharyngeal swab, presence of viral mutation(s) within the  areas targeted by this assay, and inadequate number of viral copies  (<250 copies / mL). A negative result must be combined with clinical  observations, patient history, and epidemiological information. If result is POSITIVE SARS-CoV-2 target nucleic acids are DETECTED. The SARS-CoV-2 RNA is generally detectable in upper and lower  respiratory specimens dur ing the acute phase of  infection.  Positive  results are indicative of active infection with SARS-CoV-2.  Clinical  correlation with patient history and other diagnostic information is  necessary to determine patient infection status.  Positive results do  not rule out bacterial infection or co-infection with other viruses. If result is PRESUMPTIVE POSTIVE SARS-CoV-2 nucleic acids MAY BE PRESENT.   A presumptive positive result was obtained on the submitted specimen  and confirmed on repeat testing.  While 2019 novel coronavirus  (SARS-CoV-2) nucleic acids may be present in the submitted sample  additional confirmatory testing may be necessary for epidemiological  and / or clinical management purposes  to differentiate between  SARS-CoV-2 and other Sarbecovirus currently known to infect humans.  If clinically indicated additional testing with an alternate test  methodology [phone]) is advised. The SARS-CoV-2 RNA is generally  detectable in upper and lower respiratory sp ecimens during the acute  phase of infection. The expected result is Negative. Fact Sheet for Patients:  BoilerBrush.com.cy Fact Sheet for Healthcare Providers: https://pope.com/ This test is not yet approved or cleared by the [place] FDA and has been authorized for detection and/or diagnosis of SARS-CoV-2 by FDA under an Emergency Use Authorization (EUA).  This EUA will remain in effect (meaning this test can be used) for the duration of the COVID-19 declaration under Section 564(b)(1) of the Act, 21 U.S.C. section 360bbb-3(b)(1), unless the authorization is terminated or revoked sooner. Performed at [facility], [address] [place], [place] [address]   CBC     Status: Abnormal   Collection Time: 01/15/19 10:42 PM  Result Value Ref Range  WBC 17.0 (H) 4.0 - 10.5 K/uL   RBC 4.17 3.87 - 5.11 MIL/uL   Hemoglobin 11.7 (L) 12.0 - 15.0 g/dL   HCT 34.9 (L) 36.0 - 46.0 %    MCV 83.7 80.0 - 100.0 fL   MCH 28.1 26.0 - 34.0 pg   MCHC 33.5 30.0 - 36.0 g/dL   RDW 16.9 (H) 11.5 - 15.5 %   Platelets 179 150 - 400 K/uL   nRBC 0.0 0.0 - 0.2 %    Comment: Performed at [facility], [address] [place], [place] [address]  Type and screen [place]     Status: None   Collection Time: 01/15/19 10:42 PM  Result Value Ref Range   ABO/RH(D) AB POS    Antibody Screen NEG    Sample Expiration      01/18/2019,2359 Performed at [facility], [address] [place], [place] [address]   RPR     Status: None   Collection Time: 01/15/19 10:42 PM  Result Value Ref Range   RPR Ser Ql NON REACTIVE NON REACTIVE    Comment: Performed at [facility] [address] [place], [place] [address]  Rapid HIV screen (HIV 1/2 Ab+Ag) ([facility] Only)     Status: None   Collection Time: 01/15/19 10:42 PM  Result Value Ref Range   HIV-1 P24 Antigen - HIV24 NON REACTIVE NON REACTIVE    Comment: (NOTE) Detection of p24 may be inhibited by biotin in the sample, causing false negative results in acute infection.    HIV 1/2 Antibodies NON REACTIVE NON REACTIVE   Interpretation (HIV Ag Ab)      A non reactive test result means that HIV 1 or HIV 2 antibodies and HIV 1 p24 antigen were not detected in the specimen.    Comment: Performed at [facility], [address] [place], [place] [address]  Urine Drug Screen, Qualitative [facility] only)     Status: Abnormal   Collection Time: 01/15/19 11:10 PM  Result Value Ref Range   Tricyclic, Ur Screen NONE DETECTED NONE DETECTED   Amphetamines, Ur Screen NONE DETECTED NONE DETECTED   MDMA (Ecstasy)Ur Screen NONE DETECTED NONE DETECTED   Cocaine Metabolite,Ur [place] NONE DETECTED NONE DETECTED   Opiate, Ur Screen NONE DETECTED NONE DETECTED   Phencyclidine (PCP) Ur S NONE DETECTED NONE DETECTED   Cannabinoid 50 Ng, Ur  NONE DETECTED NONE DETECTED   Barbiturates, Ur Screen NONE DETECTED NONE  DETECTED   Benzodiazepine, Ur Scrn NONE DETECTED NONE DETECTED   Methadone Scn, Ur POSITIVE (A) NONE DETECTED    Comment: (NOTE) Tricyclics + metabolites, urine    Cutoff 1000 ng/mL Amphetamines + metabolites, urine  Cutoff 1000 ng/mL MDMA (Ecstasy), urine              Cutoff 500 ng/mL Cocaine Metabolite, urine          Cutoff 300 ng/mL Opiate + metabolites, urine        Cutoff 300 ng/mL Phencyclidine (PCP), urine         Cutoff 25 ng/mL Cannabinoid, urine                 Cutoff 50 ng/mL Barbiturates + metabolites, urine  Cutoff 200 ng/mL Benzodiazepine, urine              Cutoff 200 ng/mL Methadone, urine                   Cutoff  300 ng/mL The urine drug screen provides only a preliminary, unconfirmed analytical test result and should not be used for non-medical purposes. Clinical consideration and professional judgment should be applied to any positive drug screen result due to possible interfering substances. A more specific alternate chemical method must be used in order to obtain a confirmed analytical result. Gas chromatography / mass spectrometry (GC/MS) is the preferred confirmat ory method. Performed at [facility], [address] [place], [place] [address]   CBC     Status: Abnormal   Collection Time: 01/16/19  8:19 AM  Result Value Ref Range   WBC 15.4 (H) 4.0 - 10.5 K/uL   RBC 3.70 (L) 3.87 - 5.11 MIL/uL   Hemoglobin 10.3 (L) 12.0 - 15.0 g/dL   HCT 40.931.1 (L) 81.136.0 - 91.446.0 %   MCV 84.1 80.0 - 100.0 fL   MCH 27.8 26.0 - 34.0 pg   MCHC 33.1 30.0 - 36.0 g/dL   RDW 78.216.8 (H) 95.611.5 - 21.315.5 %   Platelets 159 150 - 400 K/uL   nRBC 0.0 0.0 - 0.2 %    Comment: Performed at [facility], [address] [place], [place] [address]    Assessment:   28 y.o. G1P1001 postpartum day # 1 TSVD  Plan:    1) Acute blood loss anemia - hemodynamically stable and asymptomatic - po ferrous sulfate  2) Blood Type --/--/AB POS (09/24 2242) / Rubella 1.47 (07/30  1447) / Varicella Immune  3) TDAP status up to date  4) Feeding plan breast & bottle  5)  Education given regarding options for contraception, as well as compatibility with breast feeding if applicable.  Patient plans on Depo-Provera injections and tubal ligation for contraception.  6) Disposition - home  Vena AustriaAndreas Kerstin Crusoe, MD, Merlinda FrederickFACOG [facility], [facility] 01/17/2019, 10:12 AM

## 2019-01-18 MED ORDER — IBUPROFEN 600 MG PO TABS: 600.0000 mg | ORAL_TABLET | Freq: Four times a day (QID) | ORAL | 0 refills | Status: DC

## 2019-01-18 MED ORDER — MEDROXYPROGESTERONE ACETATE 150 MG/ML IM SUSP
150.0000 mg | INTRAMUSCULAR | Status: DC
  Administered 2019-01-18: 150 mg via INTRAMUSCULAR
  Filled 2019-01-18: qty 1

## 2019-01-18 NOTE — Progress Notes (Signed)
Reviewed D/C instructions with pt and family. Pt verbalized understanding of teaching. Discharged to home via W/C. Pt to schedule f/u appt.  

## 2019-01-18 NOTE — Social Work (Signed)
TOC CM/SW consult received.   The patient is a 28 year old female who gave birth to a baby girl at [facility] on 01/16/2019. Social work consult received for positive [place] and positive drug screening.  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 She noted that she relocated to [facility] from [place] October 25, 2018.   Support systems: Patient's boyfriend and father of her newborn is very supportive. Primary care in place: [facility]. Plans on talking to her PCP about medication therapy for depression. Currently receiving outpatient psychotherapy treatment with [facility].  Has other family supports.   Assessment and recommendations:  Patient admitted to depressing symptoms in the past however she denied current symptoms.  Denied SI/HI.  Has supports in place.  Patient advised to consult with her primary care provider and OPT following hospital discharge.  Patient provided with resources for [facility] and the surrounding area.    Berenice Bouton, MSW, LCSW  [phone] 8am-6pm (weekends) or CSW ED # [phone]

## 2019-01-18 NOTE — Discharge Summary (Signed)
Obstetric Discharge Summary Reason for Admission: onset of labor Prenatal Procedures: none Intrapartum Procedures: spontaneous vaginal delivery Postpartum Procedures: none Complications-Operative and Postpartum: none Hemoglobin  Date Value Ref Range Status  01/16/2019 10.3 (L) 12.0 - 15.0 g/dL Final  11/20/2018 9.8 (L) 11.1 - 15.9 g/dL Final   HCT  Date Value Ref Range Status  01/16/2019 31.1 (L) 36.0 - 46.0 % Final   Hematocrit  Date Value Ref Range Status  11/20/2018 30.1 (L) 34.0 - 46.6 % Final    Physical Exam:  General: alert, appears stated age and no distress Lochia: appropriate Uterine Fundus: firm DVT Evaluation: No evidence of DVT seen on physical exam.  Discharge Diagnoses: Term Pregnancy-delivered  Discharge Information: Date: 01/18/2019 Activity: pelvic rest Diet: routine Allergies as of 01/18/2019      Reactions   Acetaminophen-pamabrom Nausea Only      Medication List    STOP taking these medications   ferrous sulfate 325 (65 FE) MG tablet   ondansetron 4 MG disintegrating tablet Commonly known as: Zofran ODT     TAKE these medications   ibuprofen 600 MG tablet Commonly known as: ADVIL Take 1 tablet (600 mg total) by mouth every 6 (six) hours.   methadone 10 MG/ML solution Commonly known as: DOLOPHINE Take 90 mg by mouth daily.   multivitamin-prenatal 27-0.8 MG Tabs tablet Take 1 tablet by mouth daily at 12 noon.       Condition: stable Discharge to: home Follow-up Information    Schuman, Christanna R, MD Follow up in 6 week(s).   Specialty: Obstetrics and Gynecology Why: 6 week postpartum visit with Dr. Carl Best information: [place] [place] [id number] [phone]           Newborn Data: Live born female  Birth Weight: 6 lb 11 oz (3033 g) APGAR: 36, 9  Newborn Delivery   Birth date/time: 01/16/2019 00:03:00 Delivery type: Vaginal, Spontaneous      Home with mother.  Malachy Mood 01/18/2019,  7:25 AM

## 2019-01-18 NOTE — Lactation Note (Addendum)
This note was copied from a baby's chart. Lactation Consultation Note  Patient Name: Margaret Krueger XAJOI'N Date: 01/18/2019   Tried to convince mom to put [facility] to the breast again.  The last time I assisted mom with breast feeding, [name] latched with minimal assistance using #20 nipple shield.  [name] had strong rhythmic suck with occasional swallow.  Mom took her off after 5 minutes and gave her a bottle of formula reporting that it was just too painful.  Mom's nipple is slightly red, but no trauma noted.  Mom has been given coconut oil and comfort gels with instructions in use.  Also encouraged mom to rub her own milk on her nipples to lubricate, prevent bacteria, heal and relieve discomfort. There was transitional breast milk in nipple shield.  Mom was willing to pump and give her breast milk.  Set up Symphony in room with instructions in pumping, collection, storage, labeling, cleaning and handling of breast milk. The first time mom pumped bilaterally she expressed 12 ml from one breast and 17 ml from other breast. Praised mom for her efforts to supply her healthy breastmilk for [facility].  Explained importance to [facility] to get as much of her breast milk while she was taking Methadone.  Mom was beaming and so proud of herself.  Reviewed supply and demand, normal course of lactation and routine newborn feeding pattern.  Mom was discharged today, but Victory Dakin remains as eat, sleep, console evaluation with her parents at her side.  Referral for mom to get DEBP through [facility] has already been faxed to [facility].  Lactation name and number written on white board and encouraged to call with any questions, concerns or assistance.         Maternal Data    Feeding    LATCH Score                   Interventions    Lactation Tools Discussed/Used     Consult Status      Jarold Motto 01/18/2019, 3:35 PM

## 2019-01-19 ENCOUNTER — Ambulatory Visit: Payer: Self-pay

## 2019-01-19 ENCOUNTER — Encounter: Payer: Medicaid Other | Admitting: Obstetrics & Gynecology

## 2019-01-19 NOTE — Lactation Note (Signed)
This note was copied from a baby's chart. Lactation Consultation Note  Patient Name: Margaret Krueger PVXYI'A Date: 01/19/2019 Reason for consult: Follow-up assessment  LC spoke with dad this morning. Mom has went to the methadone clinic for daily care.  Dad reports mom has stayed consistent with pumping every 2-2.5 hours, and is able to fill almost 2 full snappies with breast milk. Extra breast milk is being stored in the fridge for use as needed. Baby was switched to extra slow flow nipple, and dad reports better toleration and less spillage from the side to baby [name]'s mouth.  Wet/stool diapers are exceeding expectations or baby's age, and dad reports stools to be light brown in color showing mom's milk is in the transition stage. When asked about latching baby to breast, dad says mom is still interested, but is reporting extremely sore nipples, and at this time is preferring to only pump. LC will check in with mom this afternoon when she returns.  Maternal Data Formula Feeding for Exclusion: No Has patient been taught Hand Expression?: Yes  Feeding Feeding Type: Bottle Fed - Breast Milk Nipple Type: Extra Slow Flow(given to use at next feeding)  LATCH Score                   Interventions Interventions: Breast feeding basics reviewed  Lactation Tools Discussed/Used     Consult Status Consult Status: Follow-up Date: 01/19/19 Follow-up type: In-patient    Lavonia Drafts 01/19/2019, 10:58 AM

## 2019-01-20 ENCOUNTER — Ambulatory Visit: Payer: Self-pay

## 2019-01-20 NOTE — Lactation Note (Signed)
This note was copied from a baby's chart. Lactation Consultation Note  Patient Name: Girl Eddis Pingleton NTIRW'E Date: 01/20/2019 Reason for consult: Follow-up assessment  LC spoke with dad this morning. Baby Victory Dakin continues to do well receiving breastmilk via bottle. Consuming various amounts throughout the night, in what appears to be a cluster feeding period.  Mom is continuing to pump routinely every 2-3 hours, emptying her breasts and having no concerns. Dad is well versed on paced-bottle feeding, and has no questions.  LC will follow-up when mom returns.  Maternal Data Formula Feeding for Exclusion: No Has patient been taught Hand Expression?: Yes Does the patient have breastfeeding experience prior to this delivery?: No  Feeding    LATCH Score                   Interventions    Lactation Tools Discussed/Used     Consult Status Consult Status: Follow-up Date: 01/20/19 Follow-up type: In-patient    Lavonia Drafts 01/20/2019, 11:19 AM

## 2019-01-21 ENCOUNTER — Ambulatory Visit: Payer: Self-pay

## 2019-01-21 NOTE — Lactation Note (Signed)
This note was copied from a baby's chart. Lactation Consultation Note  Patient Name: Margaret Krueger [id number] Date: 01/21/2019 Reason for consult: Follow-up assessment  LC spoke with parents before discharge. Mom is continuing to pump on routine every 2-3 hours. Receiving copious amounts of milk each pumping session 14ml or more. Baby Margaret Krueger consuming around 8ml/feed, and continues to do well with extra slow flow nipple. Mom unsure at this time about transitioning infant back to the breast, due to experience of sore nipples and early severe engorgement.  Plan is for mom and Margaret Krueger to follow-up with [facility] office for DEBP and ongoing breastfeeding support. Provided breastfeeding basics information for the next days/weeks to come. Reviewed newborn feeding patterns, stomach sizes, growth spurts/cluster feedings, wet/stool diaper expectations with exclusive breastmilk.  Went over [employer] for reference once home. Information given for outpatient lactation consult information, and breastfeeding support groups.  Maternal Data Formula Feeding for Exclusion: No Has patient been taught Hand Expression?: Yes Does the patient have breastfeeding experience prior to this delivery?: No  Feeding Feeding Type: Bottle Fed - Formula Nipple Type: Extra Slow Flow  LATCH Score                   Interventions Interventions: Breast feeding basics reviewed;DEBP  Lactation Tools Discussed/Used WIC Program: Yes   Consult Status Consult Status: Complete Date: 01/21/19 Follow-up type: Call as needed    Lavonia Drafts 01/21/2019, 9:30 AM

## 2019-02-26 ENCOUNTER — Ambulatory Visit (INDEPENDENT_AMBULATORY_CARE_PROVIDER_SITE_OTHER): Payer: Medicaid Other | Admitting: Obstetrics and Gynecology

## 2019-02-26 ENCOUNTER — Encounter: Payer: Self-pay | Admitting: Obstetrics and Gynecology

## 2019-02-26 ENCOUNTER — Other Ambulatory Visit: Payer: Self-pay

## 2019-02-26 DIAGNOSIS — Z1389 Encounter for screening for other disorder: Secondary | ICD-10-CM

## 2019-02-26 DIAGNOSIS — Z793 Long term (current) use of hormonal contraceptives: Secondary | ICD-10-CM

## 2019-02-26 NOTE — Progress Notes (Signed)
Postpartum Visit  Chief Complaint:  Chief Complaint  Patient presents with  . Postpartum Care    History of Present Illness: Patient is a 28 y.o. G1P1001 presents for postpartum visit.  Review the Delivery Report for details.  Date of delivery:  Information for the patient's newborn:  Sherwood Gambler [[id number]]  01/16/2019   Type of delivery: Vaginal delivery -  Episiotomy No.  Laceration: yes  Pregnancy or labor problems:  yes  Breast Feeding:  no Lochia: none Post partum depression/anxiety noted:  no Edinburgh Post-Partum Depression Score:     Date of last PAP: 11/26/2018  normal  Any problems since the delivery:  no  Newborn Details:  SINGLETON :  1. BabyGender:female. Birth weight:  Information for the patient's newborn:  Sherwood Gambler [[id number]]  6 lb 11 oz (3.033 kg)   Infant Status: Infant doing well at home with mother.   Review of Systems: ROS   Past Medical History:  Past Medical History:  Diagnosis Date  . Anemia   . Substance abuse [facility])     Past Surgical History:  Past Surgical History:  Procedure Laterality Date  . elbow sugery    . NO PAST SURGERIES      Family History:  Family History  Problem Relation Age of Onset  . Leukemia Mother     Social History:  Social History   Socioeconomic History  . Marital status: Single    Spouse name: Not on file  . Number of children: Not on file  . Years of education: Not on file  . Highest education level: Not on file  Occupational History  . Not on file  Social Needs  . Financial resource strain: Not on file  . Food insecurity    Worry: Not on file    Inability: Not on file  . Transportation needs    Medical: Not on file    Non-medical: Not on file  Tobacco Use  . Smoking status: Current Every Day Smoker    Packs/day: 0.50    Types: Cigarettes  . Smokeless tobacco: Never Used  Substance and Sexual Activity  . Alcohol use: Not Currently  . Drug use: Not  Currently  . Sexual activity: Yes    Birth control/protection: None  Lifestyle  . Physical activity    Days per week: Not on file    Minutes per session: Not on file  . Stress: Not on file  Relationships  . Social Musician on phone: Not on file    Gets together: Not on file    Attends religious service: Not on file    Active member of club or organization: Not on file    Attends meetings of clubs or organizations: Not on file    Relationship status: Not on file  . Intimate partner violence    Fear of current or ex partner: Not on file    Emotionally abused: Not on file    Physically abused: Not on file    Forced sexual activity: Not on file  Other Topics Concern  . Not on file  Social History Narrative  . Not on file    Allergies:  Allergies  Allergen Reactions  . Acetaminophen-Pamabrom Nausea Only    Medications: Prior to Admission medications   Medication Sig Start Date End Date Taking? Authorizing Provider  methadone (DOLOPHINE) 10 MG/ML solution Take 90 mg by mouth daily.   Yes [provider]  Prenatal Vit-Fe Fumarate-FA (  MULTIVITAMIN-PRENATAL) 27-0.8 MG TABS tablet Take 1 tablet by mouth daily at 12 noon.   Yes [provider]  ibuprofen (ADVIL) 600 MG tablet Take 1 tablet (600 mg total) by mouth every 6 (six) hours. Patient not taking: Reported on 02/26/2019 01/18/19   Malachy Mood, MD  medroxyPROGESTERone (DEPO-PROVERA) 150 MG/ML injection Inject 1 mL (150 mg total) into the muscle every 3 (three) months. 02/27/19 05/28/19  Homero Fellers, MD    Physical Exam Vitals:  Vitals:   02/26/19 1510  BP: 110/60  Pulse: 89    Physical Exam Genitourinary:     Genitourinary Comments: External: Normal appearing vulva. No lesions noted.  Speculum examination: Normal appearing cervix. No blood in the vaginal vault. no discharge.  Well healed 2nd degree laceration     Assessment: 28 y.o. G1P1001 presenting for 6 week postpartum  visit  Plan: Problem List Items Addressed This Visit    None    Visit Diagnoses    Postpartum care and examination    -  Primary   Uses 58-month hormonal injection as primary birth control method       Relevant Medications   medroxyPROGESTERone (DEPO-PROVERA) 150 MG/ML injection   SVD (spontaneous vaginal delivery)           1) Contraception Education given regarding options for contraception, including injectable contraception.  2)  Pap: ASCCP guidelines and rational discussed.  Patient opts for 3 year screening interval  3) Patient underwent screening for postpartum depression with no concerns noted.   - Follow up 1 year for routine annual exam  Adrian Prows MD [place] 02/27/2019 7:34 AM

## 2019-02-27 ENCOUNTER — Encounter: Payer: Self-pay | Admitting: Obstetrics and Gynecology

## 2019-02-27 MED ORDER — MEDROXYPROGESTERONE ACETATE 150 MG/ML IM SUSP: 150.0000 mg | INTRAMUSCULAR | 3 refills | Status: DC

## 2019-03-10 ENCOUNTER — Ambulatory Visit: Payer: Medicaid Other | Admitting: Obstetrics and Gynecology

## 2019-03-23 NOTE — Progress Notes (Deleted)
Patient, No Pcp Per   No chief complaint on file.   HPI:      [name] is a 28 y.o. G1P1001 who LMP was No LMP recorded., presents today for ***  Vag sx PP visit 11/20  Patient Active Problem List   Diagnosis Date Noted  . Uterine contractions during pregnancy 01/15/2019  . Chronic hepatitis C without hepatic coma (HCC) 12/30/2018  . Methadone maintenance treatment affecting pregnancy, antepartum (HCC) 11/27/2018  . Anemia during pregnancy in first trimester 11/27/2018  . Supervision of high risk pregnancy, antepartum 11/20/2018    Past Surgical History:  Procedure Laterality Date  . elbow sugery    . NO PAST SURGERIES      Family History  Problem Relation Age of Onset  . Leukemia Mother     Social History   Socioeconomic History  . Marital status: Single    Spouse name: Not on file  . Number of children: Not on file  . Years of education: Not on file  . Highest education level: Not on file  Occupational History  . Not on file  Social Needs  . Financial resource strain: Not on file  . Food insecurity    Worry: Not on file    Inability: Not on file  . Transportation needs    Medical: Not on file    Non-medical: Not on file  Tobacco Use  . Smoking status: Current Every Day Smoker    Packs/day: 0.50    Types: Cigarettes  . Smokeless tobacco: Never Used  Substance and Sexual Activity  . Alcohol use: Not Currently  . Drug use: Not Currently  . Sexual activity: Yes    Birth control/protection: None  Lifestyle  . Physical activity    Days per week: Not on file    Minutes per session: Not on file  . Stress: Not on file  Relationships  . Social Musician on phone: Not on file    Gets together: Not on file    Attends religious service: Not on file    Active member of club or organization: Not on file    Attends meetings of clubs or organizations: Not on file    Relationship status: Not on file  . Intimate partner violence   Fear of current or ex partner: Not on file    Emotionally abused: Not on file    Physically abused: Not on file    Forced sexual activity: Not on file  Other Topics Concern  . Not on file  Social History Narrative  . Not on file    Outpatient Medications Prior to Visit  Medication Sig Dispense Refill  . ibuprofen (ADVIL) 600 MG tablet Take 1 tablet (600 mg total) by mouth every 6 (six) hours. (Patient not taking: Reported on 02/26/2019) 30 tablet 0  . medroxyPROGESTERone (DEPO-PROVERA) 150 MG/ML injection Inject 1 mL (150 mg total) into the muscle every 3 (three) months. 1 mL 3  . methadone (DOLOPHINE) 10 MG/ML solution Take 90 mg by mouth daily.    . Prenatal Vit-Fe Fumarate-FA (MULTIVITAMIN-PRENATAL) 27-0.8 MG TABS tablet Take 1 tablet by mouth daily at 12 noon.     No facility-administered medications prior to visit.       ROS:  Review of Systems BREAST: No symptoms   OBJECTIVE:   Vitals:  There were no vitals taken for this visit.  Physical Exam  Results: No results found for this or any previous visit (from  the past 24 hour(s)).   Assessment/Plan: No diagnosis found.    No orders of the defined types were placed in this encounter.     No follow-ups on file.  Margaret Krueger B. Crystale Giannattasio, PA-C 03/23/2019 7:46 PM

## 2019-03-24 ENCOUNTER — Ambulatory Visit: Payer: Medicaid Other | Admitting: Obstetrics and Gynecology

## 2019-04-13 ENCOUNTER — Ambulatory Visit (INDEPENDENT_AMBULATORY_CARE_PROVIDER_SITE_OTHER): Payer: Medicaid Other

## 2019-04-13 ENCOUNTER — Telehealth: Payer: Self-pay

## 2019-04-13 ENCOUNTER — Other Ambulatory Visit: Payer: Self-pay

## 2019-04-13 ENCOUNTER — Other Ambulatory Visit: Payer: Self-pay | Admitting: Student

## 2019-04-13 DIAGNOSIS — Z3042 Encounter for surveillance of injectable contraceptive: Secondary | ICD-10-CM | POA: Diagnosis not present

## 2019-04-13 DIAGNOSIS — B182 Chronic viral hepatitis C: Secondary | ICD-10-CM

## 2019-04-13 MED ORDER — MEDROXYPROGESTERONE ACETATE 150 MG/ML IM SUSP
150.0000 mg | Freq: Once | INTRAMUSCULAR | Status: AC
  Administered 2019-04-13: 15:00:00 150 mg via INTRAMUSCULAR

## 2019-04-13 NOTE — Telephone Encounter (Signed)
What can she do for this or does she need an exam?

## 2019-04-13 NOTE — Telephone Encounter (Signed)
Pt came in today to get Depo Provera and stated she has waited to have intercourse. When she does it is very painful. She states she had to have stitches at delivery and Gilman Schmidt was keeping on area

## 2019-04-22 ENCOUNTER — Other Ambulatory Visit: Payer: Self-pay | Admitting: Obstetrics and Gynecology

## 2019-04-22 DIAGNOSIS — N941 Unspecified dyspareunia: Secondary | ICD-10-CM

## 2019-04-22 MED ORDER — LIDOCAINE 5 % EX OINT: 1.0000 "application " | TOPICAL_OINTMENT | CUTANEOUS | 0 refills | Status: DC | PRN

## 2019-04-22 NOTE — Telephone Encounter (Signed)
I called and left a message. First intercourse can be painful after a delivery. Some times there can be a spot that is particularly sensitive. This generally gets better over time. She could try a topical lidocaine applied to the sensitive area. I can send rx for this if desired. If it is not improved then an exam in office would be appropriate.

## 2019-04-29 ENCOUNTER — Other Ambulatory Visit: Payer: Self-pay

## 2019-04-29 ENCOUNTER — Ambulatory Visit
Admission: RE | Admit: 2019-04-29 | Discharge: 2019-04-29 | Disposition: A | Discharge status: Home / Self Care | Payer: Medicaid Other | Source: Ambulatory Visit | Attending: Student | Admitting: Student

## 2019-04-29 DIAGNOSIS — B182 Chronic viral hepatitis C: Secondary | ICD-10-CM | POA: Insufficient documentation

## 2019-05-02 NOTE — Progress Notes (Signed)
[facility]  Telephone:(336) [phone] Fax:(336) [phone]  ID: Margaret Krueger OB: 19-Feb-1991  MR#: [id number]  HLK#:[id number]  Patient Care Team: Armando Gang, FNP as PCP - General (Family Medicine)  CHIEF COMPLAINT: Iron deficiency anemia.  INTERVAL HISTORY: Patient returns to clinic today for repeat laboratory work and further evaluation.  She had an uneventful delivery of baby girl in October 2020.  She continues to feel fatigue, but attributes this to waking up multiple times a night to feed her daughter.  She otherwise feels well. She has no neurologic complaints.  She denies any recent fevers or illnesses.  She has no chest pain, shortness of breath, cough, or hemoptysis.  She denies any nausea, vomiting, constipation, or diarrhea.  She has no melena or hematochezia.  She has no urinary complaints.  Patient offers no further specific complaints today.  REVIEW OF SYSTEMS:   Review of Systems  Constitutional: Positive for malaise/fatigue. Negative for fever and weight loss.  Respiratory: Negative.  Negative for cough, hemoptysis and shortness of breath.   Cardiovascular: Negative.  Negative for chest pain and leg swelling.  Gastrointestinal: Negative.  Negative for abdominal pain, blood in stool and melena.  Genitourinary: Negative.  Negative for hematuria.  Musculoskeletal: Negative.  Negative for back pain.  Skin: Negative.  Negative for rash.  Neurological: Negative.  Negative for dizziness, focal weakness, weakness and headaches.  Psychiatric/Behavioral: Negative.  The patient is not nervous/anxious.     As per HPI. Otherwise, a complete review of systems is negative.  PAST MEDICAL HISTORY: Past Medical History:  Diagnosis Date  . Anemia   . Substance abuse (HCC)     PAST SURGICAL HISTORY: Past Surgical History:  Procedure Laterality Date  . elbow sugery    . NO PAST SURGERIES      FAMILY HISTORY: Family History  Problem Relation Age of  Onset  . Leukemia Mother     ADVANCED DIRECTIVES (Y/N):  N  HEALTH MAINTENANCE: Social History   Tobacco Use  . Smoking status: Current Every Day Smoker    Packs/day: 0.50    Types: Cigarettes  . Smokeless tobacco: Never Used  Substance Use Topics  . Alcohol use: Not Currently  . Drug use: Not Currently     Colonoscopy:  PAP:  Bone density:  Lipid panel:  Allergies  Allergen Reactions  . Acetaminophen-Pamabrom Nausea Only    Current Outpatient Medications  Medication Sig Dispense Refill  . ferrous sulfate 325 (65 FE) MG tablet Take by mouth.    Marland Kitchen ibuprofen (ADVIL) 600 MG tablet Take 1 tablet (600 mg total) by mouth every 6 (six) hours. (Patient not taking: Reported on 02/26/2019) 30 tablet 0  . lidocaine (XYLOCAINE) 5 % ointment Apply 1 application topically as needed. 35.44 g 0  . medroxyPROGESTERone (DEPO-PROVERA) 150 MG/ML injection Inject 1 mL (150 mg total) into the muscle every 3 (three) months. 1 mL 3  . methadone (DOLOPHINE) 10 MG/ML solution Take 90 mg by mouth daily.    . Prenatal Vit-Fe Fumarate-FA (MULTIVITAMIN-PRENATAL) 27-0.8 MG TABS tablet Take 1 tablet by mouth daily at 12 noon.    . Prenatal Vit-Fe Fumarate-FA (MULTIVITAMIN-PRENATAL) 27-0.8 MG TABS tablet Take by mouth.     No current facility-administered medications for this visit.    OBJECTIVE: Vitals:   05/08/19 1318  BP: 121/80  Pulse: 77  Resp: 18  Temp: 98.9 F (37.2 C)  SpO2: 100%     Body mass index is 28.34 kg/m.    ECOG  FS:0 - Asymptomatic  General: Well-developed, well-nourished, no acute distress. Eyes: Pink conjunctiva, anicteric sclera. HEENT: Normocephalic, moist mucous membranes. Lungs: No audible wheezing or coughing. Heart: Regular rate and rhythm. Abdomen: Soft, nontender, no obvious distention. Musculoskeletal: No edema, cyanosis, or clubbing. Neuro: Alert, answering all questions appropriately. Cranial nerves grossly intact. Skin: No rashes or petechiae noted. Psych:  Normal affect.  LAB RESULTS:  No results found for: NA, K, CL, CO2, GLUCOSE, BUN, CREATININE, CALCIUM, PROT, ALBUMIN, AST, ALT, ALKPHOS, BILITOT, GFRNONAA, GFRAA  Lab Results  Component Value Date   WBC 6.0 05/08/2019   NEUTROABS 3.5 05/08/2019   HGB 12.9 05/08/2019   HCT 39.6 05/08/2019   MCV 90.2 05/08/2019   PLT 222 05/08/2019   Lab Results  Component Value Date   IRON 127 05/08/2019   TIBC 402 05/08/2019   IRONPCTSAT 32 (H) 05/08/2019   Lab Results  Component Value Date   FERRITIN 66 05/08/2019     STUDIES: US ABDOMEN COMPLETE W/ELASTOGRAPHY  Result Date: 04/29/2019 CLINICAL DATA:  Chronic hepatitis-C without hepatic coma EXAM: ULTRASOUND ABDOMEN ULTRASOUND HEPATIC ELASTOGRAPHY TECHNIQUE: Sonography of the upper abdomen was performed. In addition, ultrasound elastography evaluation of the liver was performed. A region of interest was placed within the right lobe of the liver. Following application of a compressive sonographic pulse, tissue compressibility was assessed. Multiple assessments were performed at the selected site. Median tissue compressibility was determined. Previously, hepatic stiffness was assessed by shear wave velocity. Based on recently published Society of Radiologists in Ultrasound consensus article, reporting is now recommended to be performed in the SI units of pressure (kiloPascals) representing hepatic stiffness/elasticity. The obtained result is compared to the published reference standards. (cACLD= compensated Advanced Chronic Liver Disease) COMPARISON:  None FINDINGS: ULTRASOUND ABDOMEN Gallbladder: Normally distended without stones or wall thickening. No pericholecystic fluid or sonographic Murphy sign. Common bile duct: Diameter: 4 mm, normal Liver: Upper normal echogenicity without mass or nodularity. No intrahepatic biliary dilatation. Portal vein is patent on color Doppler imaging with normal direction of blood flow towards the liver. IVC: Normal  appearance Pancreas: Normal appearance Spleen: Normal appearance, 10.5 cm length Right Kidney: Length: 12.4 cm. Normal morphology without mass or hydronephrosis. Left Kidney: Length: 11.9 cm. Normal morphology without mass or hydronephrosis. Abdominal aorta: Normal caliber Other findings: No free fluid ULTRASOUND HEPATIC ELASTOGRAPHY Device: Siemens Helix VTQ Patient position: Supine Transducer 5C1 Number of measurements: 10 Hepatic segment:  8 Median kPa: 3.4 IQR: 0.3 IQR/Median kPa ratio: 0.1 Data quality:  Good Diagnostic category:  ?5 kPa: high probability of being normal IMPRESSION: ULTRASOUND ABDOMEN: Normal exam. ULTRASOUND HEPATIC ELASTOGRAPHY: Median kPa: 3.4 kPa Diagnostic category:  ?5 kPa: high probability of being normal The use of hepatic elastography is applicable to patients with viral hepatitis and non-alcoholic fatty liver disease. At this time, there is insufficient data for the referenced cut-off values and use in other causes of liver disease, including alcoholic liver disease. Patients, however, may be assessed by elastography and serve as their own reference standard/baseline. In patients with non-alcoholic liver disease, the values suggesting compensated advanced chronic liver disease (cACLD) may be lower, and patients may need additional testing with elasticity results of 7-9 kPa. Please note that abnormal hepatic elasticity and shear wave velocities may also be identified in clinical settings other than with hepatic fibrosis, such as: acute hepatitis, elevated right heart and central venous pressures including use of beta blockers, veno-occlusive disease (Budd-Chiari), infiltrative processes such as mastocytosis/amyloidosis/infiltrative tumor/lymphoma, extrahepatic cholestasis, with hyperemia in the  post-prandial state, and with liver transplantation. Correlation with patient history, laboratory data, and clinical condition recommended. Diagnostic Categories: ?5 kPa: high probability of  being normal ?9 kPa: in the absence of other known clinical signs, rules out cACLD >9 kPa and ?13 kPa: suggestive of cACLD, but needs further testing >13 kPa: highly suggestive of cACLD ?17 kPa: highly suggestive of cACLD with an increased probability of clinically significant portal hypertension Electronically Signed   By: Ulyses Southward M.D.   On: 04/29/2019 13:08    ASSESSMENT: Iron deficiency anemia.  PLAN:    1.  Iron deficiency anemia: Resolved.  Patient's hemoglobin and iron stores are now well within normal limits. Previously, B12, folate, and hemoglobinopathy profile were all within normal limits.  She does not require additional IV Feraheme.  Patient was instructed to continue her oral iron supplementation.  No further follow-up is necessary.  Please refer patient back if there are any questions or concerns.   2.  Pregnancy: Patient had a healthy baby girl born in October 2020.  Patient expressed understanding and was in agreement with this plan. She also understands that She can call clinic at any time with any questions, concerns, or complaints.    Jeralyn Ruths, MD   05/08/2019 2:37 PM

## 2019-05-07 ENCOUNTER — Other Ambulatory Visit: Payer: Self-pay

## 2019-05-07 ENCOUNTER — Encounter: Payer: Self-pay | Admitting: Oncology

## 2019-05-07 NOTE — Progress Notes (Signed)
Patient prescreened for appointment. Patient has no concerns or questions.  

## 2019-05-08 ENCOUNTER — Inpatient Hospital Stay: Payer: Medicaid Other | Attending: Oncology | Admitting: Oncology

## 2019-05-08 ENCOUNTER — Other Ambulatory Visit: Payer: Self-pay

## 2019-05-08 ENCOUNTER — Inpatient Hospital Stay: Payer: Medicaid Other

## 2019-05-08 VITALS — BP 121/80 | HR 77 | Temp 98.9°F | Resp 18 | Wt 160.0 lb

## 2019-05-08 DIAGNOSIS — D509 Iron deficiency anemia, unspecified: Secondary | ICD-10-CM | POA: Diagnosis not present

## 2019-05-08 DIAGNOSIS — O99011 Anemia complicating pregnancy, first trimester: Secondary | ICD-10-CM

## 2019-05-08 LAB — CBC WITH DIFFERENTIAL/PLATELET
Abs Immature Granulocytes: 0.01 10*3/uL (ref 0.00–0.07)
Basophils Absolute: 0 10*3/uL (ref 0.0–0.1)
Basophils Relative: 0 %
Eosinophils Absolute: 0.1 10*3/uL (ref 0.0–0.5)
Eosinophils Relative: 2 %
HCT: 39.6 % (ref 36.0–46.0)
Hemoglobin: 12.9 g/dL (ref 12.0–15.0)
Immature Granulocytes: 0 %
Lymphocytes Relative: 34 %
Lymphs Abs: 2.1 10*3/uL (ref 0.7–4.0)
MCH: 29.4 pg (ref 26.0–34.0)
MCHC: 32.6 g/dL (ref 30.0–36.0)
MCV: 90.2 fL (ref 80.0–100.0)
Monocytes Absolute: 0.4 10*3/uL (ref 0.1–1.0)
Monocytes Relative: 6 %
Neutro Abs: 3.5 10*3/uL (ref 1.7–7.7)
Neutrophils Relative %: 58 %
Platelets: 222 10*3/uL (ref 150–400)
RBC: 4.39 MIL/uL (ref 3.87–5.11)
RDW: 12.3 % (ref 11.5–15.5)
WBC: 6 10*3/uL (ref 4.0–10.5)
nRBC: 0 % (ref 0.0–0.2)

## 2019-05-08 LAB — IRON AND TIBC
Iron: 127 ug/dL (ref 28–170)
Saturation Ratios: 32 % — ABNORMAL HIGH (ref 10.4–31.8)
TIBC: 402 ug/dL (ref 250–450)
UIBC: 275 ug/dL

## 2019-05-08 LAB — FERRITIN: Ferritin: 66 ng/mL (ref 11–307)

## 2019-07-06 ENCOUNTER — Ambulatory Visit: Payer: Medicaid Other

## 2019-09-10 ENCOUNTER — Ambulatory Visit: Payer: Medicaid Other | Admitting: Obstetrics and Gynecology

## 2020-10-17 ENCOUNTER — Other Ambulatory Visit: Payer: Self-pay

## 2020-10-17 ENCOUNTER — Emergency Department
Admission: EM | Admit: 2020-10-17 | Discharge: 2020-10-17 | Disposition: A | Discharge status: Home / Self Care | Payer: Medicaid Other | Attending: Emergency Medicine | Admitting: Emergency Medicine

## 2020-10-17 DIAGNOSIS — U071 COVID-19: Secondary | ICD-10-CM | POA: Insufficient documentation

## 2020-10-17 DIAGNOSIS — Z28310 Unvaccinated for covid-19: Secondary | ICD-10-CM | POA: Diagnosis not present

## 2020-10-17 DIAGNOSIS — F17210 Nicotine dependence, cigarettes, uncomplicated: Secondary | ICD-10-CM | POA: Insufficient documentation

## 2020-10-17 DIAGNOSIS — B349 Viral infection, unspecified: Secondary | ICD-10-CM

## 2020-10-17 DIAGNOSIS — J029 Acute pharyngitis, unspecified: Secondary | ICD-10-CM | POA: Diagnosis present

## 2020-10-17 LAB — SARS CORONAVIRUS 2 (TAT 6-24 HRS): SARS Coronavirus 2: POSITIVE — AB

## 2020-10-17 MED ORDER — LIDOCAINE VISCOUS HCL 2 % MT SOLN: 5.0000 mL | Freq: Four times a day (QID) | OROMUCOSAL | 0 refills | Status: DC | PRN

## 2020-10-17 MED ORDER — IBUPROFEN 800 MG PO TABS: 800.0000 mg | ORAL_TABLET | Freq: Three times a day (TID) | ORAL | 0 refills | Status: DC | PRN

## 2020-10-17 MED ORDER — PSEUDOEPH-BROMPHEN-DM 30-2-10 MG/5ML PO SYRP: 5.0000 mL | ORAL_SOLUTION | Freq: Four times a day (QID) | ORAL | 0 refills | Status: DC | PRN

## 2020-10-17 NOTE — ED Triage Notes (Signed)
Pt states she took an at home covid test today that is positive, states she is just here for a work note to tell her when she can return to work

## 2020-10-17 NOTE — ED Provider Notes (Signed)
[facility] Emergency Department Provider Note   ____________________________________________   Event Date/Time   First MD Initiated Contact with Patient 10/17/20 [phone]     (approximate)  I have reviewed the triage vital signs and the nursing notes.   HISTORY  Chief Complaint Covid Positive    HPI Margaret Krueger is a 30 y.o. female patient state she took a home test for COVID-19 yesterday and it came back positive.  Patient requesting work note.  Patient states symptoms consist of sore throat, fatigue, chest and nasal congestion, and coughing.  Patient denies fever with complaint.  Patient denies recent travel or known contact with COVID-19.  Patient is not taking vaccine.         Past Medical History:  Diagnosis Date   Anemia    Substance abuse [facility])     Patient Active Problem List   Diagnosis Date Noted   Iron deficiency anemia 05/08/2019   Uterine contractions during pregnancy 01/15/2019   Chronic hepatitis C without hepatic coma (HCC) 12/30/2018   Methadone maintenance treatment affecting pregnancy, antepartum (HCC) 11/27/2018   Anemia during pregnancy 11/27/2018   Supervision of high risk pregnancy, antepartum 11/20/2018    Past Surgical History:  Procedure Laterality Date   elbow sugery     NO PAST SURGERIES      Prior to Admission medications   Medication Sig Start Date End Date Taking? Authorizing Provider  brompheniramine-pseudoephedrine-DM 30-2-10 MG/5ML syrup Take 5 mLs by mouth 4 (four) times daily as needed. Mix with 5 mL of viscous lidocaine for swish and swallow 10/17/20  Yes Joni Reining, PA-C  ibuprofen (ADVIL) 800 MG tablet Take 1 tablet (800 mg total) by mouth every 8 (eight) hours as needed for moderate pain. 10/17/20  Yes Joni Reining, PA-C  lidocaine (XYLOCAINE) 2 % solution Use as directed 5 mLs in the mouth or throat every 6 (six) hours as needed for mouth pain. Mix with 5 mL of Bromfed-DM for swish and swallow  10/17/20  Yes Joni Reining, PA-C  ferrous sulfate 325 (65 FE) MG tablet Take by mouth.    [provider]  lidocaine (XYLOCAINE) 5 % ointment Apply 1 application topically as needed. 04/22/19   Schuman, Jaquelyn Bitter, MD  medroxyPROGESTERone (DEPO-PROVERA) 150 MG/ML injection Inject 1 mL (150 mg total) into the muscle every 3 (three) months. 02/27/19 05/28/19  Natale Milch, MD  methadone (DOLOPHINE) 10 MG/ML solution Take 90 mg by mouth daily.    [provider]    Allergies Acetaminophen-pamabrom  Family History  Problem Relation Age of Onset   Leukemia Mother     Social History Social History   Tobacco Use   Smoking status: Every Day    Packs/day: 0.50    Pack years: 0.00    Types: Cigarettes   Smokeless tobacco: Never  Vaping Use   Vaping Use: Never used  Substance Use Topics   Alcohol use: Yes   Drug use: Not Currently    Review of Systems Constitutional: No fever/chills.  Body aches and fatigue. Eyes: No visual changes. ENT: Sore throat.   Cardiovascular: Denies chest pain. Respiratory: Denies shortness of breath. Gastrointestinal: No abdominal pain.  No nausea, no vomiting.  No diarrhea.  No constipation. Genitourinary: Negative for dysuria. Musculoskeletal: Negative for back pain. Skin: Negative for rash. Neurological: Negative for headaches, focal weakness or numbness. Psychiatric: Substance abuse Endocrine: Hepatitis C Hematological/Lymphatic: Iron deficiency anemia Allergic/Immunilogical: Tylenol. ____________________________________________   PHYSICAL EXAM:  VITAL SIGNS:  ED Triage Vitals  Enc Vitals Group     BP 10/17/20 0909 118/78     Pulse Rate 10/17/20 0909 (!) 107     Resp 10/17/20 0909 18     Temp --      Temp Source 10/17/20 0909 Oral     SpO2 10/17/20 0909 97 %     Weight 10/17/20 0910 140 lb (63.5 kg)     Height 10/17/20 0910 5\' 3"  (1.6 m)     Head Circumference --      Peak Flow --      Pain Score 10/17/20  0910 7     Pain Loc --      Pain Edu? --      Excl. in GC? --     Constitutional: Alert and oriented. Well appearing and in no acute distress. Eyes: Conjunctivae are normal. PERRL. EOMI. Head: Atraumatic. Nose: Edematous nasal turbinates clear rhinorrhea. Mouth/Throat: Mucous membranes are moist.  Oropharynx non-erythematous. Neck: No stridor.   Hematological/Lymphatic/Immunilogical: No cervical lymphadenopathy. Cardiovascular: Tachycardic, regular rhythm. Grossly normal heart sounds.  Good peripheral circulation. Respiratory: Normal respiratory effort.  No retractions. Lungs CTAB. Gastrointestinal: Soft and nontender. No distention. No abdominal bruits. No CVA tenderness. Genitourinary: Deferred Musculoskeletal: No lower extremity tenderness nor edema.  No joint effusions. Neurologic:  Normal speech and language. No gross focal neurologic deficits are appreciated. No gait instability. Skin:  Skin is warm, dry and intact. No rash noted. Psychiatric: Mood and affect are normal. Speech and behavior are normal.  ____________________________________________   LABS (all labs ordered are listed, but only abnormal results are displayed)  Labs Reviewed  SARS CORONAVIRUS 2 (TAT 6-24 HRS)   ____________________________________________  EKG   ____________________________________________  RADIOLOGY I, 10/19/20, personally viewed and evaluated these images (plain radiographs) as part of my medical decision making, as well as reviewing the written report by the radiologist.  ED MD interpretation:    Official radiology report(s): No results found.  ____________________________________________   PROCEDURES  Procedure(s) performed (including Critical Care):  Procedures   ____________________________________________   INITIAL IMPRESSION / ASSESSMENT AND PLAN / ED COURSE  As part of my medical decision making, I reviewed the following data within the electronic medical  record:          Patient states positive home test for COVID-19.  Patient complains of sore throat, body aches, fatigue, nasal and chest congestion.  Patient advised COVID-19 test results are pending and can be found later today in the MyChart app.  Patient given discharge care instructions and a work note.  Take medication as directed.   ____________________________________________   FINAL CLINICAL IMPRESSION(S) / ED DIAGNOSES  Final diagnoses:  Viral illness     ED Discharge Orders          Ordered    brompheniramine-pseudoephedrine-DM 30-2-10 MG/5ML syrup  4 times daily PRN        10/17/20 0924    lidocaine (XYLOCAINE) 2 % solution  Every 6 hours PRN        10/17/20 0924    ibuprofen (ADVIL) 800 MG tablet  Every 8 hours PRN        10/17/20 10/19/20             Note:  This document was prepared using Dragon voice recognition software and may include unintentional dictation errors.    0160, PA-C 10/17/20 0931    10/19/20, MD 10/18/20 [phone]

## 2020-10-17 NOTE — Discharge Instructions (Addendum)
COVID 19 test is pending.  Read and follow discharge care instructions.  Take medication as directed.  Advised self quarantine pending results of COVID-19 test.  If test is positive must quarantine additional 10 days.

## 2021-07-17 ENCOUNTER — Encounter: Payer: Self-pay | Admitting: Oncology

## 2021-08-03 ENCOUNTER — Encounter: Payer: Self-pay | Admitting: Surgery

## 2021-08-03 ENCOUNTER — Telehealth: Payer: Self-pay | Admitting: Surgery

## 2021-08-03 ENCOUNTER — Encounter: Payer: Self-pay | Admitting: Oncology

## 2021-08-03 ENCOUNTER — Ambulatory Visit: Payer: Medicaid Other | Admitting: Surgery

## 2021-08-03 VITALS — BP 126/86 | HR 92 | Temp 98.0°F | Ht 63.0 in | Wt 164.4 lb

## 2021-08-03 DIAGNOSIS — K429 Umbilical hernia without obstruction or gangrene: Secondary | ICD-10-CM

## 2021-08-03 DIAGNOSIS — K640 First degree hemorrhoids: Secondary | ICD-10-CM | POA: Diagnosis not present

## 2021-08-03 NOTE — H&P (View-Only) (Signed)
Patient ID: Margaret Krueger, female   DOB: 01/15/1991, 30 y.o.   MRN: [id number] ? ?Chief Complaint: Umbilical hernia and hemorrhoids ? ?History of Present Illness ?Margaret Krueger is a 30 y.o. female with an umbilical hernia which began 2 years ago after pregnancy, it seems the hemorrhoids began about the same time.  The hernia causes some pain, bulges at times and gets firm.  Does not spontaneously reduce at night.  Associated nausea when the pain is severe.  She has some concern about hemorrhoidal bleeding.  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  She also likes to eat cheese.  She reports good oral fluid intake but denies any fiber supplementation.  She reports bowel movements are 2-3 times a day and often with a degree of straining in an effort involved. ? ?Past Medical History ?Past Medical History:  ?Diagnosis Date  ? Anemia   ? Bipolar 1 disorder (HCC)   ? Substance abuse (HCC)   ?  ? ? ?Past Surgical History:  ?Procedure Laterality Date  ? elbow sugery Right 1998  ? ? ?No Known Allergies ? ?Current Outpatient Medications  ?Medication Sig Dispense Refill  ? cariprazine (VRAYLAR) 1.5 MG capsule Take by mouth.    ? ?No current facility-administered medications for this visit.  ? ? ?Family History ?Family History  ?Problem Relation Age of Onset  ? Leukemia Mother   ?  ? ? ?Social History ?Social History  ? ?Tobacco Use  ? Smoking status: Former  ?  Types: Cigarettes  ?  Passive exposure: Past  ? Smokeless tobacco: Never  ?Vaping Use  ? Vaping Use: Every day  ?Substance Use Topics  ? Alcohol use: Yes  ? Drug use: Not Currently  ? ? ?Review of Systems  ?Constitutional: Negative.   ?HENT: Negative.    ?Eyes: Negative.   ?Respiratory: Negative.    ?Cardiovascular: Negative.   ?Gastrointestinal: Negative.   ?Genitourinary: Negative.   ?Skin: Negative.   ?Neurological: Negative.   ?Psychiatric/Behavioral:  Positive for depression.   ?  ? ?Physical Exam ?Blood pressure 126/86, pulse 92, temperature 98  ?F (36.7 ?C), height 5' 3" (1.6 m), weight 164 lb 6.4 oz (74.6 kg), last menstrual period 07/10/2021, SpO2 98 %, currently breastfeeding. ?Last Weight  Most recent update: 08/03/2021  9:18 AM  ? ? Weight  ?74.6 kg (164 lb 6.4 oz)  ?      ? ?  ? ? ?CONSTITUTIONAL: Well developed, and nourished, appropriately responsive and aware without distress.   ?EYES: Sclera non-icteric.   ?EARS, NOSE, MOUTH AND THROAT:  The oropharynx is clear. Oral mucosa is pink and moist.    Hearing is intact to voice.  ?NECK: Trachea is midline, and there is no jugular venous distension.  ?LYMPH NODES:  Lymph nodes in the neck are not enlarged. ?RESPIRATORY:  Lungs are clear, and breath sounds are equal bilaterally. Normal respiratory effort without pathologic use of accessory muscles. ?CARDIOVASCULAR: Heart is regular in rate and rhythm. ?GI: The abdomen is soft, nontender, and nondistended.  There is a central umbilical bulge, that is readily reducible to 80 appreciated fascial defect of less than 1.5 cm.  There were no palpable masses. I did not appreciate hepatosplenomegaly. There were normal bowel sounds. ?DRE: Chaperone present Caryl Lyn.  There is minimal soft noninflamed external hemorrhoidal tags, grade 1 internal hemorrhoids without redundancy, significant mobility or remarkable palpable cords.  Nontender on exam.  Minimal rectocele anteriorly.  No gross blood on exam.    No stool on exam. ?MUSCULOSKELETAL:  Symmetrical muscle tone appreciated in all four extremities.    ?SKIN: Skin turgor is normal. No pathologic skin lesions appreciated.  ?NEUROLOGIC:  Motor and sensation appear grossly normal.  Cranial nerves are grossly without defect. ?PSYCH:  Alert and oriented to person, place and time. Affect is appropriate for situation. ? ?Data Reviewed ?I have personally reviewed what is currently available of the patient's imaging, recent labs and medical records.   ?Labs:  ? ?  Latest Ref Rng & Units 05/08/2019  ?  1:10 PM 01/16/2019  ?   8:19 AM 01/15/2019  ? 10:42 PM  ?CBC  ?WBC 4.0 - 10.5 K/uL 6.0   15.4   17.0    ?Hemoglobin 12.0 - 15.0 g/dL 12.9   10.3   11.7    ?Hematocrit 36.0 - 46.0 % 39.6   31.1   34.9    ?Platelets 150 - 400 K/uL 222   159   179    ? ?   ? View : No data to display.  ?  ?  ?  ? ? ? ? ?Imaging: ? ?Within last 24 hrs: No results found. ? ?Assessment ?   ?Umbilical hernia. ?Patient Active Problem List  ? Diagnosis Date Noted  ? Iron deficiency anemia 05/08/2019  ? Uterine contractions during pregnancy 01/15/2019  ? Chronic hepatitis C without hepatic coma (HCC) 12/30/2018  ? Methadone maintenance treatment affecting pregnancy, antepartum (HCC) 11/27/2018  ? Anemia during pregnancy 11/27/2018  ? Supervision of high risk pregnancy, antepartum 11/20/2018  ? ? ?Plan ?   ?Umbilical hernia repair. ?I discussed possibility of incarceration, strangulation, enlargement in size over time, and the need for emergency surgery in the face of these.  Also reviewed the techniques of reduction should incarceration occur, and when unsuccessful to present to the ED.  Also discussed that surgery risks include recurrence which can be up to 30% in the case of complex hernias, use of prosthetic materials (mesh) and the increased risk of infection and the possible need for re-operation and removal of mesh, possibility of post-op SBO or ileus, and the risks of general anesthetic including heart attack, stroke, sudden death or some reaction to anesthetic medications. The patient, and those present, appear to understand the risks, any and all questions were answered to the patient's satisfaction.  No guarantees were ever expressed or implied.  ? ? ?Advised to pursue a goal of 25 to 30 g of fiber daily.  Made aware that the majority of this may be through natural sources, but advised to be aware of actual consumption and to ensure minimal consumption by daily supplementation.  Various forms of supplements discussed.  Recommended Psyllium husk, that mixes  well with applesauce, or the powder which goes down well shaken with chocolate milk.  ?Strongly advised to consume more fluids to ensure adequate hydration, instructed to watch color of urine to determine adequacy of hydration.  Clarity is pursued in urine output, and bowel activity that correlates to significant meal intake.   ?We need to avoid deferring having bowel movements, advised to take the time at the first sign of sensation, typically following meals, and in the morning.   ?Subsequent utilization of MiraLAX may be needed ensure at least daily movement, ideally twice daily bowel movements.  If multiple doses of MiraLAX are necessary utilize them. ?Never skip a day...  ?To be regular, we must do the above EVERY day.   ? ?Face-to-face time spent with the patient   and accompanying care providers(if present) was 45 minutes, with more than 50% of the time spent counseling, educating, and coordinating care of the patient.   ? ?These notes generated with voice recognition software. I apologize for typographical errors. ? ?Laurice Iglesia M.D., FACS ?08/03/2021, 9:41 AM ? ? ? ? ?

## 2021-08-03 NOTE — Telephone Encounter (Signed)
Patient has been advised of Pre-Admission date/time, COVID Testing date and Surgery date. ? ?Surgery Date: 08/18/21 ?Preadmission Testing Date: 08/10/21 (phone 1p-5p) ?Covid Testing Date: Not needed.    ? ?Patient has been made aware to call [phone], between 1-3:00pm the day before surgery, to find out what time to arrive for surgery.   ? ?

## 2021-08-03 NOTE — Patient Instructions (Addendum)
You have requested for your Umbilical Hernia be repaired. This will be scheduled with Dr. Claudine Mouton at [facility]. ? ?Please see your (blue)pre-care sheet for information. Our surgery scheduler will call you to verify surgery date and to go over information.  ? ?You will need to arrange to be off work for 1-2 weeks but will have to have a lifting restriction of no more than 15 lbs for 6 weeks following your surgery. ?If you have FMLA or disability paperwork that needs filled out you may drop this off at our office or this can be faxed to [phone]. ? ? ?Umbilical Hernia, Adult ?A hernia is a bulge of tissue that pushes through an opening between muscles. An umbilical hernia happens in the abdomen, near the belly button (umbilicus). The hernia may contain tissues from the small intestine, large intestine, or fatty tissue covering the intestines (omentum). Umbilical hernias in adults tend to get worse over time, and they require surgical treatment. ?There are several types of umbilical hernias. You may have: ?A hernia located just above or below the umbilicus (indirect hernia). This is the most common type of umbilical hernia in adults. ?A hernia that forms through an opening formed by the umbilicus (direct hernia). ?A hernia that comes and goes (reducible hernia). A reducible hernia may be visible only when you strain, lift something heavy, or cough. This type of hernia can be pushed back into the abdomen (reduced). ?A hernia that traps abdominal tissue inside the hernia (incarcerated hernia). This type of hernia cannot be reduced. ?A hernia that cuts off blood flow to the tissues inside the hernia (strangulated hernia). The tissues can start to die if this happens. This type of hernia requires emergency treatment. ? ?What are the causes? ?An umbilical hernia happens when tissue inside the abdomen presses on a weak area of the abdominal muscles. ?What increases the risk? ?You may have a greater risk of  this condition if you: ?Are obese. ?Have had several pregnancies. ?Have a buildup of fluid inside your abdomen (ascites). ?Have had surgery that weakens the abdominal muscles. ? ?What are the signs or symptoms? ?The main symptom of this condition is a painless bulge at or near the belly button. A reducible hernia may be visible only when you strain, lift something heavy, or cough. Other symptoms may include: ?Dull pain. ?A feeling of pressure. ? ?Symptoms of a strangulated hernia may include: ?Pain that gets increasingly worse. ?Nausea and vomiting. ?Pain when pressing on the hernia. ?Skin over the hernia becoming red or purple. ?Constipation. ?Blood in the stool. ? ?How is this diagnosed? ?This condition may be diagnosed based on: ?A physical exam. You may be asked to cough or strain while standing. These actions increase the pressure inside your abdomen and force the hernia through the opening in your muscles. Your health care provider may try to reduce the hernia by pressing on it. ?Your symptoms and medical history. ? ?How is this treated? ?Surgery is the only treatment for an umbilical hernia. Surgery for a strangulated hernia is done as soon as possible. If you have a small hernia that is not incarcerated, you may need to lose weight before having surgery. ?Follow these instructions at home: ?Lose weight, if told by your health care provider. ?Do not try to push the hernia back in. ?Watch your hernia for any changes in color or size. Tell your health care provider if any changes occur. ?You may need to avoid activities that increase pressure on your  hernia. ?Do not lift anything that is heavier than 10 lb (4.5 kg) until your health care provider says that this is safe. ?Take over-the-counter and prescription medicines only as told by your health care provider. ?Keep all follow-up visits as told by your health care provider. This is important. ?Contact a health care provider if: ?Your hernia gets larger. ?Your  hernia becomes painful. ?Get help right away if: ?You develop sudden, severe pain near the area of your hernia. ?You have pain as well as nausea or vomiting. ?You have pain and the skin over your hernia changes color. ?You develop a fever. ? ? ?Fiber: ?Advised to pursue a goal of 25 to 30 g of fiber daily.  Made aware that the majority of this may be through natural sources, but advised to be aware of actual consumption and to ensure minimal consumption by daily supplementation.  Various forms of supplements discussed.  Recommended Whole Psyllium husk, that mixes well with applesauce, or the powder which goes down well shaken with chocolate milk.  ?Strongly advised to consume more fluids to ensure adequate hydration, instructed to watch color of urine to determine adequacy of hydration.  Clarity is pursued in urine output, and bowel activity that correlates to significant meal intake.   ?We need to avoid deferring having bowel movements, advised to take the time at the first sign of sensation, typically following meals, and in the morning.   ?Subsequent utilization of MiraLAX may be needed ensure at least daily movement, ideally twice daily bowel movements.  If multiple doses of MiraLAX are necessary utilize them. ?Never skip a day...  ?To be regular, we must do the above EVERY day.  ? ? ? ? ? ?

## 2021-08-03 NOTE — Progress Notes (Signed)
Patient ID: Margaret Krueger, female   DOB: [birth year], 31 y.o.   MRN: [id number] ? ?Chief Complaint: Umbilical hernia and hemorrhoids ? ?History of Present Illness ?Margaret Krueger is a 31 y.o. female with an umbilical hernia which began 2 years ago after pregnancy, it seems the hemorrhoids began about the same time.  The hernia causes some pain, bulges at times and gets firm.  Does not spontaneously reduce at night.  Associated nausea when the pain is severe.  She has some concern about hemorrhoidal bleeding.  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  She also likes to eat cheese.  She reports good oral fluid intake but denies any fiber supplementation.  She reports bowel movements are 2-3 times a day and often with a degree of straining in an effort involved. ? ?Past Medical History ?Past Medical History:  ?Diagnosis Date  ? Anemia   ? Bipolar 1 disorder ([place])   ? Substance abuse ([place])   ?  ? ? ?Past Surgical History:  ?Procedure Laterality Date  ? elbow sugery Right 1998  ? ? ?No Known Allergies ? ?Current Outpatient Medications  ?Medication Sig Dispense Refill  ? cariprazine (VRAYLAR) 1.5 MG capsule Take by mouth.    ? ?No current facility-administered medications for this visit.  ? ? ?Family History ?Family History  ?Problem Relation Age of Onset  ? Leukemia Mother   ?  ? ? ?Social History ?Social History  ? ?Tobacco Use  ? Smoking status: Former  ?  Types: Cigarettes  ?  Passive exposure: Past  ? Smokeless tobacco: Never  ?Vaping Use  ? Vaping Use: Every day  ?Substance Use Topics  ? Alcohol use: Yes  ? Drug use: Not Currently  ? ? ?Review of Systems  ?Constitutional: Negative.   ?HENT: Negative.    ?Eyes: Negative.   ?Respiratory: Negative.    ?Cardiovascular: Negative.   ?Gastrointestinal: Negative.   ?Genitourinary: Negative.   ?Skin: Negative.   ?Neurological: Negative.   ?Psychiatric/Behavioral:  Positive for depression.   ?  ? ?Physical Exam ?Blood pressure 126/86, pulse 92, temperature 98  ?F (36.7 ?C), height 5\' 3"  (1.6 m), weight 164 lb 6.4 oz (74.6 kg), last menstrual period 07/10/2021, SpO2 98 %, currently breastfeeding. ?Last Weight  Most recent update: 08/03/2021  9:18 AM  ? ? Weight  ?74.6 kg (164 lb 6.4 oz)  ?      ? ?  ? ? ?CONSTITUTIONAL: Well developed, and nourished, appropriately responsive and aware without distress.   ?EYES: Sclera non-icteric.   ?EARS, NOSE, MOUTH AND THROAT:  The oropharynx is clear. Oral mucosa is pink and moist.    Hearing is intact to voice.  ?NECK: Trachea is midline, and there is no jugular venous distension.  ?LYMPH NODES:  Lymph nodes in the neck are not enlarged. ?RESPIRATORY:  Lungs are clear, and breath sounds are equal bilaterally. Normal respiratory effort without pathologic use of accessory muscles. ?CARDIOVASCULAR: Heart is regular in rate and rhythm. ?GI: The abdomen is soft, nontender, and nondistended.  There is a central umbilical bulge, that is readily reducible to 80 appreciated fascial defect of less than 1.5 cm.  There were no palpable masses. I did not appreciate hepatosplenomegaly. There were normal bowel sounds. ?DRE: Chaperone present Caryl Lyn.  There is minimal soft noninflamed external hemorrhoidal tags, grade 1 internal hemorrhoids without redundancy, significant mobility or remarkable palpable cords.  Nontender on exam.  Minimal rectocele anteriorly.  No gross blood on exam.  No stool on exam. ?MUSCULOSKELETAL:  Symmetrical muscle tone appreciated in all four extremities.    ?SKIN: Skin turgor is normal. No pathologic skin lesions appreciated.  ?NEUROLOGIC:  Motor and sensation appear grossly normal.  Cranial nerves are grossly without defect. ?PSYCH:  Alert and oriented to person, place and time. Affect is appropriate for situation. ? ?Data Reviewed ?I have personally reviewed what is currently available of the patient's imaging, recent labs and medical records.   ?Labs:  ? ?  Latest Ref Rng & Units 05/08/2019  ?  1:10 PM 01/16/2019  ?   8:19 AM 01/15/2019  ? 10:42 PM  ?CBC  ?WBC 4.0 - 10.5 K/uL 6.0   15.4   17.0    ?Hemoglobin 12.0 - 15.0 g/dL 12.9   10.3   11.7    ?Hematocrit 36.0 - 46.0 % 39.6   31.1   34.9    ?Platelets 150 - 400 K/uL 222   159   179    ? ?   ? View : No data to display.  ?  ?  ?  ? ? ? ? ?Imaging: ? ?Within last 24 hrs: No results found. ? ?Assessment ?   ?Umbilical hernia. ?Patient Active Problem List  ? Diagnosis Date Noted  ? Iron deficiency anemia 05/08/2019  ? Uterine contractions during pregnancy 01/15/2019  ? Chronic hepatitis C without hepatic coma ([place]) 12/30/2018  ? Methadone maintenance treatment affecting pregnancy, antepartum ([place]) 11/27/2018  ? Anemia during pregnancy 11/27/2018  ? Supervision of high risk pregnancy, antepartum 11/20/2018  ? ? ?Plan ?   ?Umbilical hernia repair. ?I discussed possibility of incarceration, strangulation, enlargement in size over time, and the need for emergency surgery in the face of these.  Also reviewed the techniques of reduction should incarceration occur, and when unsuccessful to present to the ED.  Also discussed that surgery risks include recurrence which can be up to 30% in the case of complex hernias, use of prosthetic materials (mesh) and the increased risk of infection and the possible need for re-operation and removal of mesh, possibility of post-op SBO or ileus, and the risks of general anesthetic including heart attack, stroke, sudden death or some reaction to anesthetic medications. The patient, and those present, appear to understand the risks, any and all questions were answered to the patient's satisfaction.  No guarantees were ever expressed or implied.  ? ? ?Advised to pursue a goal of 25 to 30 g of fiber daily.  Made aware that the majority of this may be through natural sources, but advised to be aware of actual consumption and to ensure minimal consumption by daily supplementation.  Various forms of supplements discussed.  Recommended Psyllium husk, that mixes  well with applesauce, or the powder which goes down well shaken with chocolate milk.  ?Strongly advised to consume more fluids to ensure adequate hydration, instructed to watch color of urine to determine adequacy of hydration.  Clarity is pursued in urine output, and bowel activity that correlates to significant meal intake.   ?We need to avoid deferring having bowel movements, advised to take the time at the first sign of sensation, typically following meals, and in the morning.   ?Subsequent utilization of MiraLAX may be needed ensure at least daily movement, ideally twice daily bowel movements.  If multiple doses of MiraLAX are necessary utilize them. ?Never skip a day...  ?To be regular, we must do the above EVERY day.   ? ?Face-to-face time spent with the patient  and accompanying care providers(if present) was 45 minutes, with more than 50% of the time spent counseling, educating, and coordinating care of the patient.   ? ?These notes generated with voice recognition software. I apologize for typographical errors. ? ?Ronny Bacon M.D., FACS ?08/03/2021, 9:41 AM ? ? ? ? ?

## 2021-08-04 ENCOUNTER — Ambulatory Visit: Payer: Self-pay | Admitting: Surgery

## 2021-08-04 DIAGNOSIS — K429 Umbilical hernia without obstruction or gangrene: Secondary | ICD-10-CM | POA: Insufficient documentation

## 2021-08-04 DIAGNOSIS — K640 First degree hemorrhoids: Secondary | ICD-10-CM | POA: Insufficient documentation

## 2021-08-08 ENCOUNTER — Ambulatory Visit: Payer: Self-pay | Admitting: Surgery

## 2021-08-10 ENCOUNTER — Other Ambulatory Visit: Payer: Self-pay

## 2021-08-10 ENCOUNTER — Encounter
Admission: RE | Admit: 2021-08-10 | Discharge: 2021-08-10 | Disposition: A | Discharge status: Home / Self Care | Payer: Medicaid Other | Source: Ambulatory Visit | Attending: Surgery | Admitting: Surgery

## 2021-08-10 VITALS — Ht 63.0 in | Wt 165.0 lb

## 2021-08-10 DIAGNOSIS — O99019 Anemia complicating pregnancy, unspecified trimester: Secondary | ICD-10-CM

## 2021-08-10 DIAGNOSIS — D508 Other iron deficiency anemias: Secondary | ICD-10-CM

## 2021-08-10 HISTORY — DX: Unspecified viral hepatitis C without hepatic coma: B19.20

## 2021-08-10 NOTE — Patient Instructions (Signed)
Your procedure is scheduled on: 08/18/21 Report to DAY SURGERY DEPARTMENT LOCATED ON 2ND FLOOR MEDICAL MALL ENTRANCE. To find out your arrival time please call [phone] between 1PM - 3PM on 08/17/21.  Remember: Instructions that are not followed completely may result in serious medical risk, up to and including death, or upon the discretion of your surgeon and anesthesiologist your surgery may need to be rescheduled.     _X__ 1. Do not eat any food or drink any liquids after midnight the night before your procedure.                 No gum chewing or hard candies.   __X__2.  On the morning of surgery brush your teeth with toothpaste and water, you                 may rinse your mouth with mouthwash if you wish.  Do not swallow any              toothpaste of mouthwash.     _X__ 3.  No Alcohol for 24 hours before or after surgery.   _X__ 4.  Do Not Smoke or use e-cigarettes For 24 Hours Prior to Your Surgery.                 Do not use any chewable tobacco products for at least 6 hours prior to                 surgery.  ____  5.  Bring all medications with you on the day of surgery if instructed.   __X__  6.  Notify your doctor if there is any change in your medical condition      (cold, fever, infections).     Do not wear jewelry, make-up, hairpins, clips or nail polish. Do not wear lotions, powders, or perfumes.  Do not shave 48 hours prior to surgery. Men may shave face and neck. Do not bring valuables to the hospital.    [facility] is not responsible for any belongings or valuables.  Contacts, dentures/partials or body piercings may not be worn into surgery. Bring a case for your contacts, glasses or hearing aids, a denture cup will be supplied. Leave your suitcase in the car. After surgery it may be brought to your room. For patients admitted to the hospital, discharge time is determined by your treatment team.   Patients discharged the day of surgery will not be allowed to  drive home.   Please read over the following fact sheets that you were given:     __X__ Take these medicines the morning of surgery with A SIP OF WATER:    1. none  2.   3.   4.  5.  6.  ____ Fleet Enema (as directed)   ____ Use CHG Soap/SAGE wipes as directed  ____ Use inhalers on the day of surgery  ____ Stop metformin/Janumet/Farxiga 2 days prior to surgery    ____ Take 1/2 of usual insulin dose the night before surgery. No insulin the morning          of surgery.   ____ Stop Blood Thinners Coumadin/Plavix/Xarelto/Pleta/Pradaxa/Eliquis/Effient/Aspirin  on   Or contact your Surgeon, Cardiologist or Medical Doctor regarding  ability to stop your blood thinners  __X__ Stop Anti-inflammatories 7 days before surgery such as Advil, Ibuprofen, Motrin,  BC or Goodies Powder, Naprosyn, Naproxen, Aleve, Aspirin   You may take Tylenol if needed  __X__ Stop all herbals  and supplements, fish oil or vitamins  until after surgery.    ____ Bring C-Pap to the hospital.

## 2021-08-17 MED ORDER — CEFAZOLIN SODIUM-DEXTROSE 2-4 GM/100ML-% IV SOLN
2.0000 g | INTRAVENOUS | Status: AC
  Administered 2021-08-18: 2 g via INTRAVENOUS

## 2021-08-17 MED ORDER — LACTATED RINGERS IV SOLN: INTRAVENOUS | Status: DC

## 2021-08-17 MED ORDER — ACETAMINOPHEN 500 MG PO TABS: 1000.0000 mg | ORAL_TABLET | ORAL | Status: AC

## 2021-08-17 MED ORDER — GABAPENTIN 300 MG PO CAPS: 300.0000 mg | ORAL_CAPSULE | ORAL | Status: AC

## 2021-08-17 MED ORDER — CHLORHEXIDINE GLUCONATE CLOTH 2 % EX PADS: 6.0000 | MEDICATED_PAD | Freq: Once | CUTANEOUS | Status: DC

## 2021-08-17 MED ORDER — ORAL CARE MOUTH RINSE: 15.0000 mL | Freq: Once | OROMUCOSAL | Status: AC

## 2021-08-17 MED ORDER — FAMOTIDINE 20 MG PO TABS
20.0000 mg | ORAL_TABLET | Freq: Once | ORAL | Status: AC
Start: 2021-08-17 — End: 2021-08-18

## 2021-08-17 MED ORDER — CELECOXIB 200 MG PO CAPS: 200.0000 mg | ORAL_CAPSULE | ORAL | Status: AC

## 2021-08-17 MED ORDER — CHLORHEXIDINE GLUCONATE 0.12 % MT SOLN: 15.0000 mL | Freq: Once | OROMUCOSAL | Status: AC

## 2021-08-17 MED ORDER — BUPIVACAINE LIPOSOME 1.3 % IJ SUSP: 20.0000 mL | Freq: Once | INTRAMUSCULAR | Status: DC

## 2021-08-18 ENCOUNTER — Ambulatory Visit
Admission: RE | Admit: 2021-08-18 | Discharge: 2021-08-18 | Disposition: A | Discharge status: Home / Self Care | Payer: Medicaid Other | Attending: Surgery | Admitting: Surgery

## 2021-08-18 ENCOUNTER — Other Ambulatory Visit: Payer: Self-pay

## 2021-08-18 ENCOUNTER — Ambulatory Visit: Payer: Medicaid Other | Admitting: Certified Registered"

## 2021-08-18 ENCOUNTER — Encounter: Payer: Self-pay | Admitting: Surgery

## 2021-08-18 ENCOUNTER — Encounter
Admission: RE | Disposition: A | Discharge status: Home / Self Care | Payer: Self-pay | Source: Home / Self Care | Attending: Surgery

## 2021-08-18 DIAGNOSIS — F319 Bipolar disorder, unspecified: Secondary | ICD-10-CM | POA: Insufficient documentation

## 2021-08-18 DIAGNOSIS — D508 Other iron deficiency anemias: Secondary | ICD-10-CM

## 2021-08-18 DIAGNOSIS — O99019 Anemia complicating pregnancy, unspecified trimester: Secondary | ICD-10-CM

## 2021-08-18 DIAGNOSIS — Z87891 Personal history of nicotine dependence: Secondary | ICD-10-CM | POA: Diagnosis not present

## 2021-08-18 DIAGNOSIS — D649 Anemia, unspecified: Secondary | ICD-10-CM | POA: Insufficient documentation

## 2021-08-18 DIAGNOSIS — K429 Umbilical hernia without obstruction or gangrene: Secondary | ICD-10-CM

## 2021-08-18 HISTORY — PX: UMBILICAL HERNIA REPAIR: SHX196

## 2021-08-18 LAB — HEMOGLOBIN AND HEMATOCRIT, BLOOD
HCT: 38.2 % (ref 36.0–46.0)
Hemoglobin: 12.5 g/dL (ref 12.0–15.0)

## 2021-08-18 LAB — URINE DRUG SCREEN, QUALITATIVE (ARMC ONLY)
Amphetamines, Ur Screen: NOT DETECTED
Barbiturates, Ur Screen: NOT DETECTED
Benzodiazepine, Ur Scrn: NOT DETECTED
Cannabinoid 50 Ng, Ur ~~LOC~~: NOT DETECTED
Cocaine Metabolite,Ur ~~LOC~~: NOT DETECTED
MDMA (Ecstasy)Ur Screen: NOT DETECTED
Methadone Scn, Ur: NOT DETECTED
Opiate, Ur Screen: NOT DETECTED
Phencyclidine (PCP) Ur S: NOT DETECTED
Tricyclic, Ur Screen: NOT DETECTED

## 2021-08-18 LAB — POCT PREGNANCY, URINE: Preg Test, Ur: NEGATIVE

## 2021-08-18 SURGERY — REPAIR, HERNIA, UMBILICAL, ADULT
Anesthesia: General | Site: Abdomen

## 2021-08-18 MED ORDER — IBUPROFEN 800 MG PO TABS: 800.0000 mg | ORAL_TABLET | Freq: Three times a day (TID) | ORAL | 0 refills | Status: DC | PRN

## 2021-08-18 MED ORDER — ROCURONIUM BROMIDE 100 MG/10ML IV SOLN
INTRAVENOUS | Status: DC | PRN
  Administered 2021-08-18: 50 mg via INTRAVENOUS

## 2021-08-18 MED ORDER — BUPIVACAINE-EPINEPHRINE 0.25% -1:200000 IJ SOLN
INTRAMUSCULAR | Status: DC | PRN
  Administered 2021-08-18: 6 mL

## 2021-08-18 MED ORDER — DEXAMETHASONE SODIUM PHOSPHATE 10 MG/ML IJ SOLN
INTRAMUSCULAR | Status: DC | PRN
  Administered 2021-08-18: 10 mg via INTRAVENOUS

## 2021-08-18 MED ORDER — FENTANYL CITRATE (PF) 100 MCG/2ML IJ SOLN
INTRAMUSCULAR | Status: AC
  Filled 2021-08-18: qty 2

## 2021-08-18 MED ORDER — PROPOFOL 10 MG/ML IV BOLUS
INTRAVENOUS | Status: DC | PRN
  Administered 2021-08-18: 110 mg via INTRAVENOUS

## 2021-08-18 MED ORDER — CEFAZOLIN SODIUM-DEXTROSE 2-4 GM/100ML-% IV SOLN
INTRAVENOUS | Status: AC
  Filled 2021-08-18: qty 100

## 2021-08-18 MED ORDER — LACTATED RINGERS IV SOLN: INTRAVENOUS | Status: DC | PRN

## 2021-08-18 MED ORDER — GABAPENTIN 300 MG PO CAPS
ORAL_CAPSULE | ORAL | Status: AC
  Administered 2021-08-18: 300 mg via ORAL
  Filled 2021-08-18: qty 1

## 2021-08-18 MED ORDER — CHLORHEXIDINE GLUCONATE 0.12 % MT SOLN
OROMUCOSAL | Status: AC
  Administered 2021-08-18: 15 mL via OROMUCOSAL
  Filled 2021-08-18: qty 15

## 2021-08-18 MED ORDER — ONDANSETRON HCL 4 MG/2ML IJ SOLN: 4.0000 mg | Freq: Once | INTRAMUSCULAR | Status: DC | PRN

## 2021-08-18 MED ORDER — FAMOTIDINE 20 MG PO TABS
ORAL_TABLET | ORAL | Status: AC
  Administered 2021-08-18: 20 mg via ORAL
  Filled 2021-08-18: qty 1

## 2021-08-18 MED ORDER — MIDAZOLAM HCL 2 MG/2ML IJ SOLN
INTRAMUSCULAR | Status: AC
  Filled 2021-08-18: qty 2

## 2021-08-18 MED ORDER — FENTANYL CITRATE (PF) 100 MCG/2ML IJ SOLN
INTRAMUSCULAR | Status: AC
Start: 2021-08-18 — End: ?
  Filled 2021-08-18: qty 2

## 2021-08-18 MED ORDER — LIDOCAINE HCL (CARDIAC) PF 100 MG/5ML IV SOSY
PREFILLED_SYRINGE | INTRAVENOUS | Status: DC | PRN
  Administered 2021-08-18: 100 mg via INTRAVENOUS

## 2021-08-18 MED ORDER — BUPIVACAINE-EPINEPHRINE (PF) 0.25% -1:200000 IJ SOLN
INTRAMUSCULAR | Status: AC
  Filled 2021-08-18: qty 30

## 2021-08-18 MED ORDER — ONDANSETRON HCL 4 MG/2ML IJ SOLN
INTRAMUSCULAR | Status: DC | PRN
  Administered 2021-08-18: 4 mg via INTRAVENOUS

## 2021-08-18 MED ORDER — FENTANYL CITRATE (PF) 100 MCG/2ML IJ SOLN
25.0000 ug | INTRAMUSCULAR | Status: DC | PRN
  Administered 2021-08-18 (×4): 25 ug via INTRAVENOUS

## 2021-08-18 MED ORDER — FENTANYL CITRATE (PF) 100 MCG/2ML IJ SOLN
INTRAMUSCULAR | Status: DC | PRN
  Administered 2021-08-18: 100 ug via INTRAVENOUS

## 2021-08-18 MED ORDER — MIDAZOLAM HCL 2 MG/2ML IJ SOLN
INTRAMUSCULAR | Status: DC | PRN
  Administered 2021-08-18: 2 mg via INTRAVENOUS

## 2021-08-18 MED ORDER — SUGAMMADEX SODIUM 500 MG/5ML IV SOLN
INTRAVENOUS | Status: DC | PRN
  Administered 2021-08-18: 300 mg via INTRAVENOUS

## 2021-08-18 MED ORDER — CELECOXIB 200 MG PO CAPS
ORAL_CAPSULE | ORAL | Status: AC
  Administered 2021-08-18: 200 mg via ORAL
  Filled 2021-08-18: qty 1

## 2021-08-18 MED ORDER — ACETAMINOPHEN 500 MG PO TABS
ORAL_TABLET | ORAL | Status: AC
  Administered 2021-08-18: 1000 mg via ORAL
  Filled 2021-08-18: qty 2

## 2021-08-18 SURGICAL SUPPLY — 32 items
ADH SKN CLS APL DERMABOND .7 (GAUZE/BANDAGES/DRESSINGS) ×1
APL PRP STRL LF DISP 70% ISPRP (MISCELLANEOUS) ×1
BLADE CLIPPER SURG (BLADE) IMPLANT
BLADE SURG 15 STRL LF DISP TIS (BLADE) ×1 IMPLANT
BLADE SURG 15 STRL SS (BLADE) ×2
CHLORAPREP W/TINT 26 (MISCELLANEOUS) ×2 IMPLANT
DERMABOND ADVANCED (GAUZE/BANDAGES/DRESSINGS) ×1
DERMABOND ADVANCED .7 DNX12 (GAUZE/BANDAGES/DRESSINGS) ×1 IMPLANT
DRAPE LAPAROTOMY 77X122 PED (DRAPES) ×2 IMPLANT
ELECT CAUTERY BLADE 6.4 (BLADE) ×2 IMPLANT
ELECT REM PT RETURN 9FT ADLT (ELECTROSURGICAL) ×2
ELECTRODE REM PT RTRN 9FT ADLT (ELECTROSURGICAL) ×1 IMPLANT
GAUZE 4X4 16PLY ~~LOC~~+RFID DBL (SPONGE) ×2 IMPLANT
GLOVE ORTHO TXT STRL SZ7.5 (GLOVE) ×2 IMPLANT
GOWN STRL REUS W/ TWL LRG LVL3 (GOWN DISPOSABLE) ×1 IMPLANT
GOWN STRL REUS W/ TWL XL LVL3 (GOWN DISPOSABLE) ×1 IMPLANT
GOWN STRL REUS W/TWL LRG LVL3 (GOWN DISPOSABLE) ×2
GOWN STRL REUS W/TWL XL LVL3 (GOWN DISPOSABLE) ×2
KIT TURNOVER KIT A (KITS) ×2 IMPLANT
MANIFOLD NEPTUNE II (INSTRUMENTS) ×2 IMPLANT
NEEDLE HYPO 22GX1.5 SAFETY (NEEDLE) ×2 IMPLANT
NS IRRIG 500ML POUR BTL (IV SOLUTION) ×2 IMPLANT
PACK BASIN MINOR ARMC (MISCELLANEOUS) ×2 IMPLANT
SPIKE FLUID TRANSFER (MISCELLANEOUS) ×2 IMPLANT
SUT ETHIBOND 0 MO6 C/R (SUTURE) ×2 IMPLANT
SUT MNCRL 4-0 (SUTURE) ×2
SUT MNCRL 4-0 27XMFL (SUTURE) ×1
SUT VIC AB 3-0 SH 27 (SUTURE) ×2
SUT VIC AB 3-0 SH 27X BRD (SUTURE) ×1 IMPLANT
SUTURE MNCRL 4-0 27XMF (SUTURE) ×1 IMPLANT
SYR 10ML LL (SYRINGE) ×2 IMPLANT
WATER STERILE IRR 500ML POUR (IV SOLUTION) ×2 IMPLANT

## 2021-08-18 NOTE — Anesthesia Preprocedure Evaluation (Addendum)
Anesthesia Evaluation  ?Patient identified by MRN, date of birth, ID band ?Patient awake ? ? ? ?Reviewed: ?Allergy & Precautions, NPO status , Patient's Chart, lab work & pertinent test results ? ?Airway ?Mallampati: II ? ?TM Distance: >3 FB ?Neck ROM: Full ? ? ? Dental ? ?(+) Teeth Intact ?  ?Pulmonary ?neg pulmonary ROS, Patient abstained from smoking., former smoker,  ?  ?Pulmonary exam normal ?breath sounds clear to auscultation ? ? ? ? ? ? Cardiovascular ?Exercise Tolerance: Good ?negative cardio ROS ?Normal cardiovascular exam ?Rhythm:Regular  ? ?  ?Neuro/Psych ?Bipolar Disorder negative neurological ROS ? negative psych ROS  ? GI/Hepatic ?negative GI ROS, Neg liver ROS, (+) Hepatitis -, C  ?Endo/Other  ?negative endocrine ROS ? Renal/GU ?negative Renal ROS  ? ?  ?Musculoskeletal ?negative musculoskeletal ROS ?(+)  ? Abdominal ?Normal abdominal exam  (+)   ?Peds ?negative pediatric ROS ?(+)  Hematology ?negative hematology ROS ?(+) Blood dyscrasia, anemia ,   ?Anesthesia Other Findings ?Past Medical History: ?No date: Anemia ?No date: Bipolar 1 disorder ([facility]) ?No date: Hepatitis C test positive ?    Comment:  treated ?No date: Substance abuse ([facility]) ? ?Past Surgical History: ?No date: DENTAL SURGERY ?    Comment:  2021 ?1998: elbow sugery; Right ? ?BMI   ? Body Mass Index: 29.23 kg/m?  ?  ? ? Reproductive/Obstetrics ?negative OB ROS ? ?  ? ? ? ? ? ? ? ? ? ? ? ? ? ?  ?  ? ? ? ? ? ? ? ?Anesthesia Physical ?Anesthesia Plan ? ?ASA: 2 ? ?Anesthesia Plan: General  ? ?Post-op Pain Management:   ? ?Induction: Intravenous ? ?PONV Risk Score and Plan: 1 and Ondansetron and Dexamethasone ? ?Airway Management Planned: Oral ETT ? ?Additional Equipment:  ? ?Intra-op Plan:  ? ?Post-operative Plan: Extubation in OR ? ?Informed Consent: I have reviewed the patients History and Physical, chart, labs and discussed the procedure including the risks, benefits and alternatives for the proposed  anesthesia with the patient or authorized representative who has indicated his/her understanding and acceptance.  ? ? ? ?Dental Advisory Given ? ?Plan Discussed with: CRNA and Surgeon ? ?Anesthesia Plan Comments:   ? ? ? ? ? ? ?Anesthesia Quick Evaluation ? ?

## 2021-08-18 NOTE — Op Note (Signed)
Umbilical Hernia Repair ? ?Pre-operative Diagnosis: Umbilical hernia ? ?Post-operative Diagnosis: same ? ?Surgeon: Campbell Lerner, MD FACS ? ?Anesthesia: General ? ? ?Findings: 1-1.5 cm fascial defect diameter  ? ? ?Estimated Blood Loss: Less than 10 mL ?        ?        ?Specimens: None ?      ?Complications: none ?        ?     ?Procedure Details  ?The patient was seen again in the Holding Room. The benefits, complications, treatment options, and expected outcomes were discussed with the patient. The risks of bleeding, infection, recurrence of symptoms, failure to resolve symptoms, bowel injury, mesh placement, mesh infection, any of which could require further surgery were reviewed with the patient. The likelihood of improving the patient's symptoms with return to their baseline status is good.  The patient and/or family concurred with the proposed plan, giving informed consent.  The patient was taken to Operating Room, identified and the procedure verified.  A Time Out was held and the above information confirmed. ? ?Prior to the induction of general anesthesia, antibiotic prophylaxis was administered. VTE prophylaxis was in place. General endotracheal anesthesia was then administered and tolerated well. After the induction, the abdomen was prepped with Chloraprep and draped in the sterile fashion. The patient was positioned in the supine position. ? ?Local infiltration with quarter percent Marcaine with epinephrine to the anticipated incision along the supraumbilical skin crease. ?Electrocautery was used to dissect through subcutaneous tissue, the hernia sac consisted of preperitoneal adipose tissue and was completely reduced into the subfascial space. ?The hernia was measured.  Less than 1.5 cm in maximum diameter I closed the hernia defect with interrupted 0 Ethibond sutures, and a vest overpants fashion.  ? ?Incision was closed in a 2 layer fashion with 3-0 Vicryl and 4-0 Monocryl. Dermabond was used to  coat the skin. Marcaine quarter percent with epinephrine was used to inject all the incision sites. Patient tolerated procedure well and there were no immediate complications. Needle and laparotomy counts were correct ? ? ?Campbell Lerner, M.D., FACS ?[facility] ? ?08/18/2021 ; 2:43 PM  ?

## 2021-08-18 NOTE — Transfer of Care (Signed)
Immediate Anesthesia Transfer of Care Note ? ?Patient: Margaret Krueger ? ?Procedure(s) Performed: HERNIA REPAIR UMBILICAL ADULT (Abdomen) ? ?Patient Location: PACU ? ?Anesthesia Type:General ? ?Level of Consciousness: drowsy ? ?Airway & Oxygen Therapy: Patient Spontanous Breathing ? ?Post-op Assessment: Report given to RN and Post -op Vital signs reviewed and stable ? ?Post vital signs: Reviewed and stable ? ?Last Vitals:  ?Vitals Value Taken Time  ?BP 136/104 08/18/21 1406  ?Temp 36.1 ?C 08/18/21 1406  ?Pulse 100 08/18/21 1409  ?Resp 17 08/18/21 1409  ?SpO2 97 % 08/18/21 1409  ?Vitals shown include unvalidated device data. ? ?Last Pain:  ?Vitals:  ? 08/18/21 1406  ?TempSrc:   ?PainSc: 0-No pain  ?   ? ?  ? ?Complications: No notable events documented. ?

## 2021-08-18 NOTE — Interval H&P Note (Signed)
History and Physical Interval Note: ? ?08/18/2021 ?12:50 PM ? ?Margaret Krueger  has presented today for surgery, with the diagnosis of umbilical hernia less 3 cm.  The various methods of treatment have been discussed with the patient and family. After consideration of risks, benefits and other options for treatment, the patient has consented to  Procedure(s): ?HERNIA REPAIR UMBILICAL ADULT (N/A) as a surgical intervention.  The patient's history has been reviewed, patient examined, no change in status, stable for surgery.  I have reviewed the patient's chart and labs.  Questions were answered to the patient's satisfaction.   ? ? ?Campbell Lerner ? ? ?

## 2021-08-18 NOTE — Discharge Instructions (Signed)
AMBULATORY SURGERY  ?DISCHARGE INSTRUCTIONS ? ? ?The drugs that you were given will stay in your system until tomorrow so for the next 24 hours you should not: ? ?Drive an automobile ?Make any legal decisions ?Drink any alcoholic beverage ? ? ?You may resume regular meals tomorrow.  Today it is better to start with liquids and gradually work up to solid foods. ? ?You may eat anything you prefer, but it is better to start with liquids, then soup and crackers, and gradually work up to solid foods. ? ? ?Please notify your doctor immediately if you have any unusual bleeding, trouble breathing, redness and pain at the surgery site, drainage, fever, or pain not relieved by medication. ? ? ? ?Additional Instructions: ? ? ? ?Please contact your physician with any problems or Same Day Surgery at [phone], Monday through Friday 6 am to 4 pm, or [place] [facility] Main number at [phone].  ?

## 2021-08-18 NOTE — Anesthesia Procedure Notes (Signed)
Procedure Name: Intubation ?Date/Time: 08/18/2021 1:27 PM ?Performed by: Philbert Riser, CRNA ?Pre-anesthesia Checklist: Patient identified, Patient being monitored, Timeout performed, Emergency Drugs available and Suction available ?Patient Re-evaluated:Patient Re-evaluated prior to induction ?Oxygen Delivery Method: Circle system utilized ?Preoxygenation: Pre-oxygenation with 100% oxygen ?Induction Type: IV induction ?Ventilation: Mask ventilation without difficulty ?Laryngoscope Size: 3 and McGraph ?Grade View: Grade I ?Tube type: Oral ?Tube size: 7.0 mm ?Number of attempts: 1 ?Airway Equipment and Method: Stylet ?Placement Confirmation: ETT inserted through vocal cords under direct vision, positive ETCO2 and breath sounds checked- equal and bilateral ?Secured at: 21 cm ?Tube secured with: Tape ?Dental Injury: Teeth and Oropharynx as per pre-operative assessment  ? ? ? ? ?

## 2021-08-18 NOTE — Anesthesia Postprocedure Evaluation (Signed)
Anesthesia Post Note ? ?Patient: Margaret Krueger ? ?Procedure(s) Performed: HERNIA REPAIR UMBILICAL ADULT (Abdomen) ? ?Patient location during evaluation: PACU ?Anesthesia Type: General ?Level of consciousness: awake and oriented ?Pain management: satisfactory to patient ?Vital Signs Assessment: post-procedure vital signs reviewed and stable ?Respiratory status: spontaneous breathing and nonlabored ventilation ?Cardiovascular status: stable ?Anesthetic complications: no ? ? ?No notable events documented. ? ? ?Last Vitals:  ?Vitals:  ? 08/18/21 1421 08/18/21 1430  ?BP: (!) 133/93 120/88  ?Pulse: (!) 103 91  ?Resp: 14 17  ?Temp:    ?SpO2: 100% 100%  ?  ?Last Pain:  ?Vitals:  ? 08/18/21 1430  ?TempSrc:   ?PainSc: 2   ? ? ?  ?  ?  ?  ?  ?  ? ?VAN STAVEREN,Maziyah Vessel ? ? ? ? ?

## 2021-08-19 ENCOUNTER — Other Ambulatory Visit: Payer: Self-pay | Admitting: Surgery

## 2021-08-19 MED ORDER — TRAMADOL-ACETAMINOPHEN 37.5-325 MG PO TABS: 2.0000 | ORAL_TABLET | Freq: Four times a day (QID) | ORAL | 0 refills | Status: AC | PRN

## 2021-08-20 ENCOUNTER — Emergency Department
Admission: EM | Admit: 2021-08-20 | Discharge: 2021-08-20 | Disposition: A | Discharge status: Home / Self Care | Payer: Medicaid Other | Attending: Emergency Medicine | Admitting: Emergency Medicine

## 2021-08-20 ENCOUNTER — Emergency Department: Payer: Medicaid Other

## 2021-08-20 ENCOUNTER — Other Ambulatory Visit: Payer: Self-pay

## 2021-08-20 DIAGNOSIS — G8918 Other acute postprocedural pain: Secondary | ICD-10-CM | POA: Insufficient documentation

## 2021-08-20 DIAGNOSIS — S301XXA Contusion of abdominal wall, initial encounter: Secondary | ICD-10-CM | POA: Diagnosis not present

## 2021-08-20 DIAGNOSIS — X58XXXA Exposure to other specified factors, initial encounter: Secondary | ICD-10-CM | POA: Insufficient documentation

## 2021-08-20 DIAGNOSIS — S3991XA Unspecified injury of abdomen, initial encounter: Secondary | ICD-10-CM | POA: Diagnosis present

## 2021-08-20 LAB — POC URINE PREG, ED: Preg Test, Ur: NEGATIVE

## 2021-08-20 LAB — COMPREHENSIVE METABOLIC PANEL
ALT: 13 U/L (ref 0–44)
AST: 20 U/L (ref 15–41)
Albumin: 4.2 g/dL (ref 3.5–5.0)
Alkaline Phosphatase: 37 U/L — ABNORMAL LOW (ref 38–126)
Anion gap: 8 (ref 5–15)
BUN: 17 mg/dL (ref 6–20)
CO2: 26 mmol/L (ref 22–32)
Calcium: 9.1 mg/dL (ref 8.9–10.3)
Chloride: 104 mmol/L (ref 98–111)
Creatinine, Ser: 0.67 mg/dL (ref 0.44–1.00)
GFR, Estimated: >60 mL/min (ref >60)
Glucose, Bld: 88 mg/dL (ref 70–99)
Potassium: 4.1 mmol/L (ref 3.5–5.1)
Sodium: 138 mmol/L (ref 135–145)
Total Bilirubin: 0.6 mg/dL (ref 0.3–1.2)
Total Protein: 8.1 g/dL (ref 6.5–8.1)

## 2021-08-20 LAB — CBC
HCT: 39.6 % (ref 36.0–46.0)
Hemoglobin: 12.9 g/dL (ref 12.0–15.0)
MCH: 27.9 pg (ref 26.0–34.0)
MCHC: 32.6 g/dL (ref 30.0–36.0)
MCV: 85.7 fL (ref 80.0–100.0)
Platelets: 280 10*3/uL (ref 150–400)
RBC: 4.62 MIL/uL (ref 3.87–5.11)
RDW: 13.3 % (ref 11.5–15.5)
WBC: 6.7 10*3/uL (ref 4.0–10.5)
nRBC: 0 % (ref 0.0–0.2)

## 2021-08-20 LAB — URINALYSIS, ROUTINE W REFLEX MICROSCOPIC
Bilirubin Urine: NEGATIVE
Glucose, UA: NEGATIVE mg/dL
Hgb urine dipstick: NEGATIVE
Ketones, ur: NEGATIVE mg/dL
Leukocytes,Ua: NEGATIVE
Nitrite: NEGATIVE
Protein, ur: NEGATIVE mg/dL
Specific Gravity, Urine: 1.014 (ref 1.005–1.030)
pH: 5 (ref 5.0–8.0)

## 2021-08-20 MED ORDER — MORPHINE SULFATE (PF) 4 MG/ML IV SOLN
4.0000 mg | Freq: Once | INTRAVENOUS | Status: AC
  Administered 2021-08-20: 4 mg via INTRAVENOUS
  Filled 2021-08-20: qty 1

## 2021-08-20 MED ORDER — OXYCODONE-ACETAMINOPHEN 5-325 MG PO TABS: 1.0000 | ORAL_TABLET | ORAL | 0 refills | Status: DC | PRN

## 2021-08-20 MED ORDER — IOHEXOL 300 MG/ML  SOLN
100.0000 mL | Freq: Once | INTRAMUSCULAR | Status: AC | PRN
  Administered 2021-08-20: 100 mL via INTRAVENOUS

## 2021-08-20 MED ORDER — ONDANSETRON HCL 4 MG/2ML IJ SOLN
4.0000 mg | Freq: Once | INTRAMUSCULAR | Status: AC
  Administered 2021-08-20: 4 mg via INTRAVENOUS
  Filled 2021-08-20: qty 2

## 2021-08-20 NOTE — ED Triage Notes (Signed)
Pt here with post op problems after an umbilical hernia surgery on Friday. Pt having severe pain but denies N/V/D and fevers. ?

## 2021-08-20 NOTE — ED Provider Notes (Signed)
? ?[facility] ?Provider Note ? ? ? Event Date/Time  ? First MD Initiated Contact with Patient 08/20/21 [phone]   ?  (approximate) ? ?History  ? ?Chief Complaint: Post-op Problem ? ?HPI ? ?Margaret Krueger is a 31 y.o. female with a past medical history bipolar who presents to the emergency department for periumbilical abdominal pain.  According to the patient she had an umbilical hernia surgery performed 2 days ago by Dr. Claudine Mouton.  She states she was discharged with ibuprofen.  Since her surgery she states the umbilical pain has worsened and continues to worsen.  She states today the pain was significant so she came to the emergency department for evaluation.  Denies any vomiting.  Denies any constipation last bowel movement was this morning. ? ?Physical Exam  ? ?Triage Vital Signs: ?ED Triage Vitals  ?Enc Vitals Group  ?   BP 08/20/21 0924 (!) 139/99  ?   Pulse Rate 08/20/21 0924 (!) 102  ?   Resp 08/20/21 0924 18  ?   Temp 08/20/21 0924 98 ?F (36.7 ?C)  ?   Temp Source 08/20/21 0924 Oral  ?   SpO2 08/20/21 0924 95 %  ?   Weight 08/20/21 0925 165 lb (74.8 kg)  ?   Height 08/20/21 0925 5\' 3"  (1.6 m)  ?   Head Circumference --   ?   Peak Flow --   ?   Pain Score 08/20/21 0925 10  ?   Pain Loc --   ?   Pain Edu? --   ?   Excl. in GC? --   ? ? ?Most recent vital signs: ?Vitals:  ? 08/20/21 0924  ?BP: (!) 139/99  ?Pulse: (!) 102  ?Resp: 18  ?Temp: 98 ?F (36.7 ?C)  ?SpO2: 95%  ? ? ?General: Awake, no distress.  ?CV:  Good peripheral perfusion.  Regular rate and rhythm  ?Resp:  Normal effort.  Equal breath sounds bilaterally.  ?Abd:  No distention.  Patient has well-appearing periumbilical incision with mild amount of ecchymosis consistent with postoperative findings.  No palpable mass.  Moderate tenderness to palpation over this area ? ? ? ?ED Results / Procedures / Treatments  ? ?RADIOLOGY ? ?I personally reviewed the CT images, no significant abnormality noted on my evaluation. ?Radiology is read  the CT is stranding around the umbilicus presumably related to recent herniorrhaphy likely postoperative changes.  Possible colitis. ? ? ?MEDICATIONS ORDERED IN ED: ?Medications - No data to display ? ? ?IMPRESSION / MDM / ASSESSMENT AND PLAN / ED COURSE  ?I reviewed the triage vital signs and the nursing notes. ? ?Patient presents emergency department for worsening periumbilical pain 2 days after an umbilical hernia repair.  Patient states since going home the pain is worsened progressively and became significant today.  Normal bowel movements including this morning.  We will check labs, CT scan and continue to closely monitor.  We will treat pain while awaiting CT results.  Patient agreeable to plan. ? ?CT consistent with possible colitis and postoperative changes no other findings.  Lab work is reassuring including normal CBC normal white blood cell count normal chemistry.  Urinalysis is normal as well.  Patient denies any history of colitis denies history of abdominal pain denies inflammatory bowel disease family history.  We will place patient on short course of pain medication have the patient follow-up with her surgeon.  Patient agreeable. ? ?FINAL CLINICAL IMPRESSION(S) / ED DIAGNOSES  ? ?Postoperative pain ? ? ?  Note:  This document was prepared using Dragon voice recognition software and may include unintentional dictation errors. ?  ?Minna Antis, MD ?08/20/21 1154 ? ?

## 2021-08-20 NOTE — ED Notes (Signed)
Dc ppw provided. PT denies any questions at this time. PT provides verbal consent for dc at this time and is ambulatory to lobby alert and oriented x4. ?

## 2021-09-07 ENCOUNTER — Telehealth: Payer: Self-pay

## 2021-09-07 ENCOUNTER — Ambulatory Visit: Payer: Medicaid Other | Admitting: Obstetrics

## 2021-09-07 ENCOUNTER — Encounter: Payer: Self-pay | Admitting: Obstetrics

## 2021-09-07 VITALS — BP 122/70 | Ht 63.0 in | Wt 171.0 lb

## 2021-09-07 DIAGNOSIS — Z7251 High risk heterosexual behavior: Secondary | ICD-10-CM

## 2021-09-07 DIAGNOSIS — Z113 Encounter for screening for infections with a predominantly sexual mode of transmission: Secondary | ICD-10-CM | POA: Diagnosis not present

## 2021-09-07 DIAGNOSIS — Z202 Contact with and (suspected) exposure to infections with a predominantly sexual mode of transmission: Secondary | ICD-10-CM | POA: Diagnosis not present

## 2021-09-07 DIAGNOSIS — Z30013 Encounter for initial prescription of injectable contraceptive: Secondary | ICD-10-CM

## 2021-09-07 LAB — POCT URINE PREGNANCY: Preg Test, Ur: NEGATIVE

## 2021-09-07 MED ORDER — LEVONORGESTREL 1.5 MG PO TABS: 1.5000 mg | ORAL_TABLET | Freq: Once | ORAL | 0 refills | Status: AC

## 2021-09-07 MED ORDER — MEDROXYPROGESTERONE ACETATE 150 MG/ML IM SUSP
150.0000 mg | Freq: Once | INTRAMUSCULAR | Status: AC
  Administered 2021-09-07: 150 mg via INTRAMUSCULAR

## 2021-09-07 NOTE — Progress Notes (Signed)
The depo given today after neg urine preg test was not supplied by the pt.

## 2021-09-07 NOTE — Progress Notes (Signed)
Chief Complaint  Patient presents with   Contraception    Pt needing birth control, interested in Depo    Exposure to STD    Pt wants STD testing   Patient Margaret Krueger is an 31 y.o. year old [id number] Patient's last menstrual period was 09/04/2021. currently nothing for contraception who presents for STI check and desire to restart depo. She denies domestic violence or sexual abuse. Patient denies vaginal discharge, odor, burning or itching. History of chlamydia and gonorrhea. History of hep C s/p Harvoni and resolution. Has had irregular bleeding for a while. She took plan B last week, has taken twice in the last 6 mo. Request today for unprotected intercourse yesterday.   Review of Systems  Constitutional:  Negative for activity change, appetite change, chills, diaphoresis, fatigue, fever and unexpected weight change.  HENT:  Negative for congestion, dental problem, drooling, ear discharge, ear pain, facial swelling, hearing loss, mouth sores, nosebleeds, postnasal drip, rhinorrhea, sinus pressure, sinus pain, sneezing, sore throat, tinnitus, trouble swallowing and voice change.   Eyes:  Positive for visual disturbance. Negative for photophobia, pain, discharge, redness and itching.  Respiratory:  Positive for shortness of breath. Negative for apnea, cough, choking, chest tightness, wheezing and stridor.   Cardiovascular:  Positive for chest pain. Negative for palpitations and leg swelling.  Gastrointestinal:  Negative for abdominal distention, abdominal pain, anal bleeding, blood in stool, constipation, diarrhea, nausea, rectal pain and vomiting.  Endocrine: Negative for cold intolerance, heat intolerance, polydipsia, polyphagia and polyuria.  Genitourinary:  Negative for decreased urine volume, difficulty urinating, dyspareunia, dysuria, enuresis, flank pain, frequency, genital sores, hematuria, menstrual problem, pelvic pain, urgency, vaginal bleeding, vaginal discharge and vaginal pain.   Musculoskeletal:  Negative for arthralgias, back pain, gait problem, joint swelling, myalgias, neck pain and neck stiffness.  Skin:  Negative for color change, pallor, rash and wound.  Allergic/Immunologic: Negative for environmental allergies, food allergies and immunocompromised state.  Neurological:  Negative for dizziness, tremors, seizures, syncope, facial asymmetry, speech difficulty, weakness, light-headedness, numbness and headaches.  Hematological:  Negative for adenopathy. Does not bruise/bleed easily.  Psychiatric/Behavioral:  Positive for dysphoric mood. Negative for behavioral problems, confusion, decreased concentration, hallucinations, self-injury, sleep disturbance and suicidal ideas. The patient is nervous/anxious. The patient is not hyperactive.    Period Cycle (Days): 28 Period Duration (Days): 5 Period Pattern: (!) Irregular Menstrual Flow: Moderate Menstrual Control: Thin pad Dysmenorrhea: None  Past Medical History:  Diagnosis Date   Anemia    Bipolar 1 disorder (HCC)    Hepatitis C test positive    treated   Substance abuse [facility])    Past Surgical History:  Procedure Laterality Date   DENTAL SURGERY     2021   elbow sugery Right 1998   UMBILICAL HERNIA REPAIR N/A 08/18/2021   Procedure: HERNIA REPAIR UMBILICAL ADULT;  Surgeon: Campbell Lerner, MD;  Location: [facility] ORS;  Service: General;  Laterality: N/A;   Family History  Problem Relation Age of Onset   Leukemia Mother    Social History   Socioeconomic History   Marital status: Single    Spouse name: Not on file   Number of children: Not on file   Years of education: Not on file   Highest education level: Not on file  Occupational History   Not on file  Tobacco Use   Smoking status: Former    Types: Cigarettes    Passive exposure: Past   Smokeless tobacco: Never  Vaping Use   Vaping Use: Every day  Substances: Nicotine, Flavoring  Substance and Sexual Activity   Alcohol use: Yes   Drug  use: Not Currently    Types: Heroin   Sexual activity: Yes    Birth control/protection: None  Other Topics Concern   Not on file  Social History Narrative   Not on file   Social Determinants of Health   Financial Resource Strain: Not on file  Food Insecurity: Not on file  Transportation Needs: Not on file  Physical Activity: Not on file  Stress: Not on file  Social Connections: Not on file  Intimate Partner Violence: Not on file    Medicine list and allergies reviewed and updated.    BP 122/70   Ht 5\' 3"  (1.6 m)   Wt 171 lb (77.6 kg)   LMP 09/04/2021   BMI 30.29 kg/m  Physical Exam Vitals and nursing note reviewed.  Constitutional:      Appearance: Normal appearance.  HENT:     Head: Normocephalic and atraumatic.  Eyes:     Extraocular Movements: Extraocular movements intact.  Pulmonary:     Effort: Pulmonary effort is normal.  Musculoskeletal:        General: Normal range of motion.     Cervical back: Normal range of motion.  Skin:    General: Skin is warm and dry.  Neurological:     General: No focal deficit present.     Mental Status: She is alert and oriented to person, place, and time.  Psychiatric:        Mood and Affect: Mood normal.        Behavior: Behavior normal.        Thought Content: Thought content normal.   Assessment and Plan: Unprotected sex - Plan: levonorgestrel (PLAN B ONE-STEP) 1.5 MG tablet, POCT urine pregnancy  Initiation of Depo Provera - Plan: POCT urine pregnancy, medroxyPROGESTERone (DEPO-PROVERA) injection 150 mg  Routine screening for STI (sexually transmitted infection) - Plan: Chlamydia/Gonococcus/Trichomonas, NAA, HIV antibody (with reflex), Hepatitis B surface antigen, Hepatitis C antibody, RPR  Possible exposure to STD - Plan: Chlamydia/Gonococcus/Trichomonas, NAA STI panel obtained via bloodwork and cultures Safe sexual practices encouraged. We discussed typical menstrual cycle and fertile window but due to recent use  of plan B this is likely cause of irregular bleeding. Rx done for plan B and depo today.  Pt is due annual/pap. She is unsure if she received gardisil.  UPT neg  Return in about 3 months (around 12/08/2021) for annual/well woman.

## 2021-09-07 NOTE — Telephone Encounter (Signed)
Patient aware.

## 2021-09-07 NOTE — Telephone Encounter (Signed)
-----   Message from Horald Pollen, MD sent at 09/07/2021 11:50 AM EDT ----- Please inform patient I gave her the incorrect information - gardisil is covered by Medicaid over age 31 in  and if she desires she can proceed with vaccination

## 2021-09-07 NOTE — Patient Instructions (Signed)
Have a great year! Please call with any concerns. Don't forget to wear your seatbelt everyday! If you are not signed up on MyChart, please ask [place] how to sign up for it!   In a world where you can be anything, please be kind.   Body mass index is 30.29 kg/m.  A Healthy Lifestyle: Care Instructions Your Care Instructions  A healthy lifestyle can help you feel good, stay at a healthy weight, and have plenty of energy for both work and play. A healthy lifestyle is something you can share with your whole family. A healthy lifestyle also can lower your risk for serious health problems, such as high blood pressure, heart disease, and diabetes. You can follow a few steps listed below to improve your health and the health of your family. Follow-up care is a key part of your treatment and safety. Be sure to make and go to all appointments, and call your doctor if you are having problems. It's also a good idea to know your test results and keep a list of the medicines you take. How can you care for yourself at home? Do not eat too much sugar, fat, or fast foods. You can still have dessert and treats now and then. The goal is moderation. Start small to improve your eating habits. Pay attention to portion sizes, drink less juice and soda pop, and eat more fruits and vegetables. Eat a healthy amount of food. A 3-ounce serving of meat, for example, is about the size of a deck of cards. Fill the rest of your plate with vegetables and whole grains. Limit the amount of soda and sports drinks you have every day. Drink more water when you are thirsty. Eat at least 5 servings of fruits and vegetables every day. It may seem like a lot, but it is not hard to reach this goal. A serving or helping is 1 piece of fruit, 1 cup of vegetables, or 2 cups of leafy, raw vegetables. Have an apple or some carrot sticks as an afternoon snack instead of a candy bar. Try to have fruits and/or vegetables at every meal. Make exercise  part of your daily routine. You may want to start with simple activities, such as walking, bicycling, or slow swimming. Try to be active 30 to 60 minutes every day. You do not need to do all 30 to 60 minutes all at once. For example, you can exercise 3 times a day for 10 or 20 minutes. Moderate exercise is safe for most people, but it is always a good idea to talk to your doctor before starting an exercise program. Keep moving. Mow the lawn, work in the garden, or [employer]. Take the stairs instead of the elevator at work. If you smoke, quit. People who smoke have an increased risk for heart attack, stroke, cancer, and other lung illnesses. Quitting is hard, but there are ways to boost your chance of quitting tobacco for good. Use nicotine gum, patches, or lozenges. Ask your doctor about stop-smoking programs and medicines. Keep trying. In addition to reducing your risk of diseases in the future, you will notice some benefits soon after you stop using tobacco. If you have shortness of breath or asthma symptoms, they will likely get better within a few weeks after you quit. Limit how much alcohol you drink. Moderate amounts of alcohol (up to 2 drinks a day for men, 1 drink a day for women) are okay. But drinking too much can lead to  liver problems, high blood pressure, and other health problems. Family health If you have a family, there are many things you can do together to improve your health. Eat meals together as a family as often as possible. Eat healthy foods. This includes fruits, vegetables, lean meats and dairy, and whole grains. Include your family in your fitness plan. Most people think of activities such as jogging or tennis as the way to fitness, but there are many ways you and your family can be more active. Anything that makes you breathe hard and gets your heart pumping is exercise. Here are some tips: Walk to do errands or to take your child to school or the bus. Go for a family  bike ride after dinner instead of watching TV. Care instructions adapted under license by your healthcare professional. This care instruction is for use with your licensed healthcare professional. If you have questions about a medical condition or this instruction, always ask your healthcare professional. [place] any warranty or liability for your use of this information.

## 2021-09-10 IMAGING — US US ABDOMEN COMPLETE W/ ELASTOGRAPHY
2 of 3 series · 12 of 25 positions shown · non-contrast
Comparison: None

CLINICAL DATA: Chronic hepatitis-C without hepatic coma

EXAM:
ULTRASOUND ABDOMEN
ULTRASOUND HEPATIC ELASTOGRAPHY
TECHNIQUE: Sonography of the upper abdomen was performed. In addition,
ultrasound elastography evaluation of the liver was performed. A
region of interest was placed within the right lobe of the liver.
Following application of a compressive sonographic pulse, tissue
compressibility was assessed. Multiple assessments were performed at
the selected site. Median tissue compressibility was determined.
Previously, hepatic stiffness was assessed by shear wave velocity.
Based on recently published Society of Radiologists in Ultrasound
consensus article, reporting is now recommended to be performed in
the SI units of pressure (kiloPascals) representing hepatic
stiffness/elasticity. The obtained result is compared to the
published reference standards. (cACLD= compensated Advanced Chronic
Liver Disease)

[Series 1: us abdomen complete w/ elastography · 10 of 111 slices shown (1 of 2)]
[im 6/111]
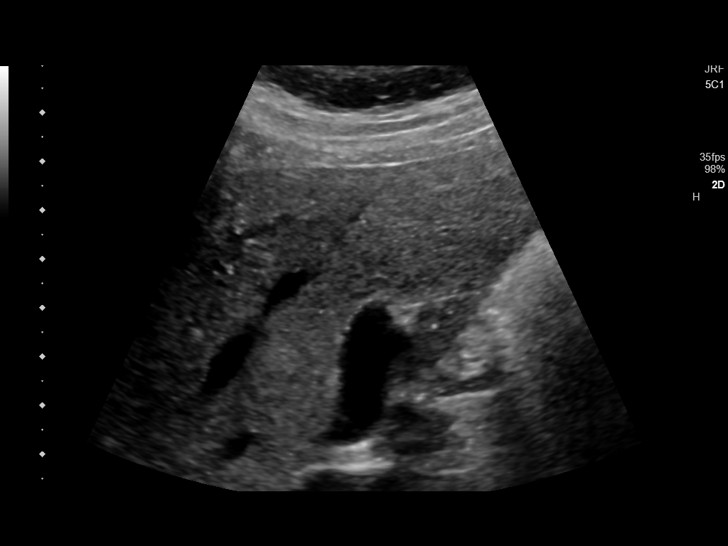
[im 17/111]
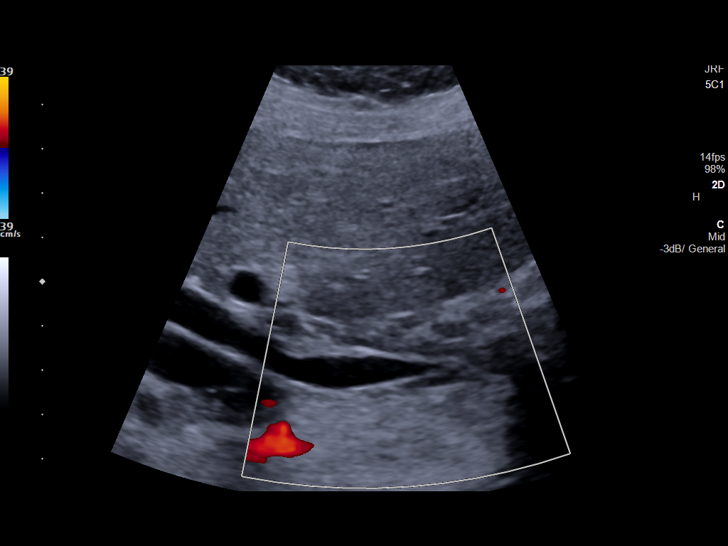
[im 28/111]
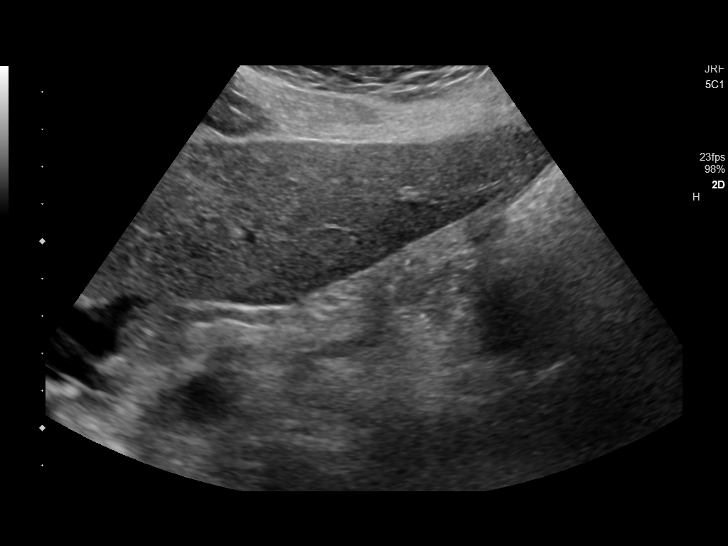
[im 39/111]
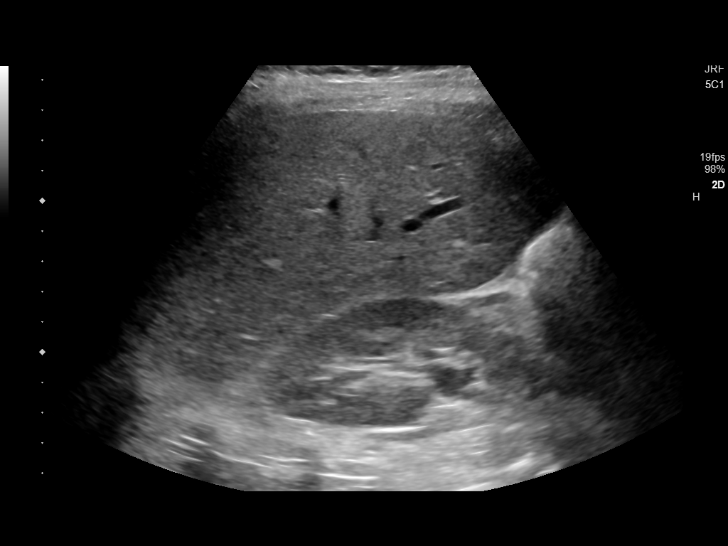
[im 50/111]
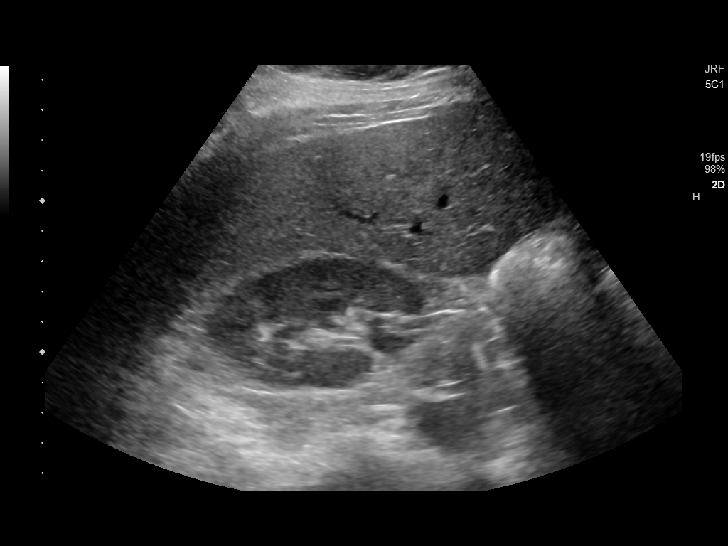
[im 61/111]
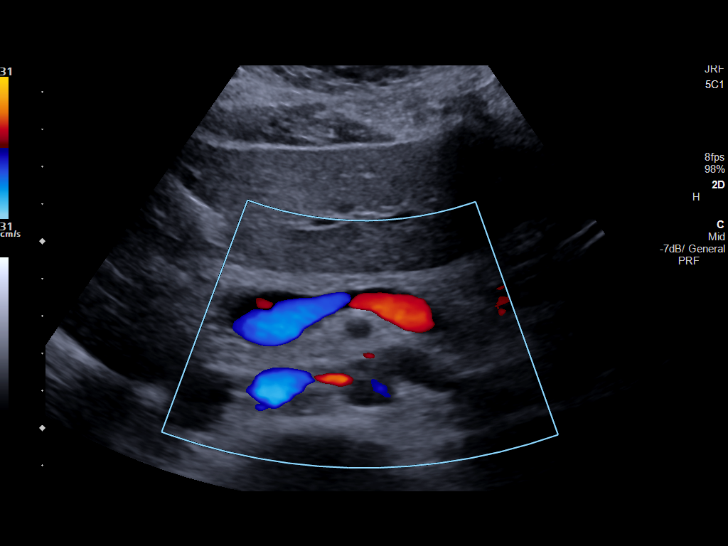
[im 72/111]
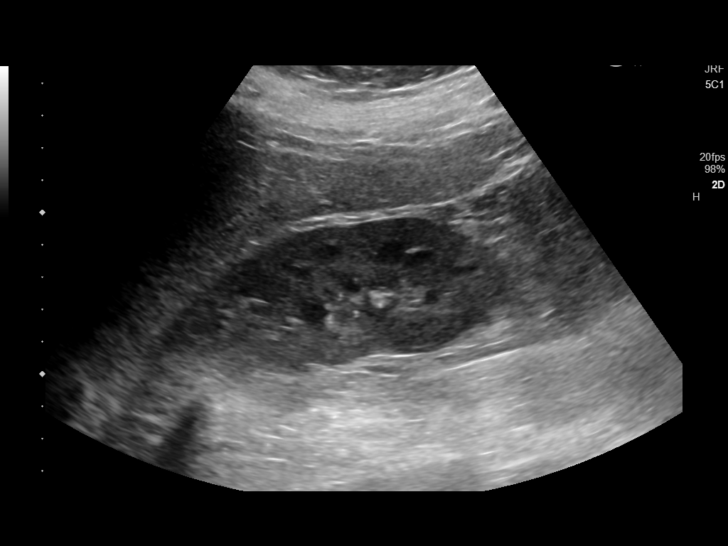
[im 83/111]
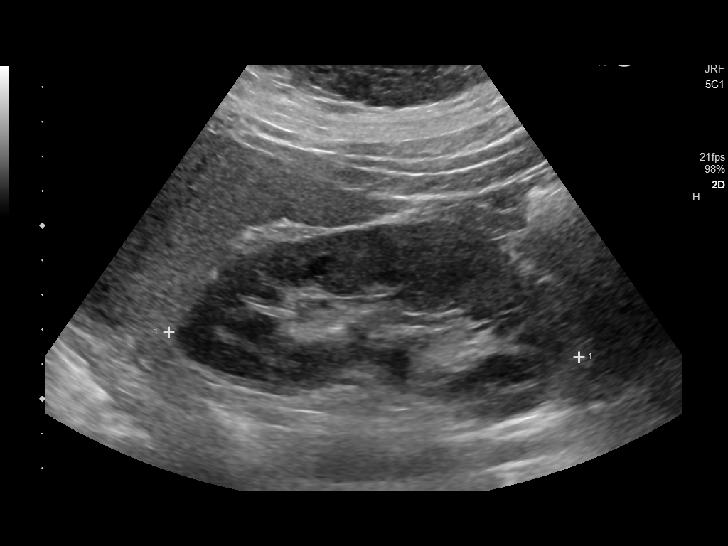
[im 94/111]
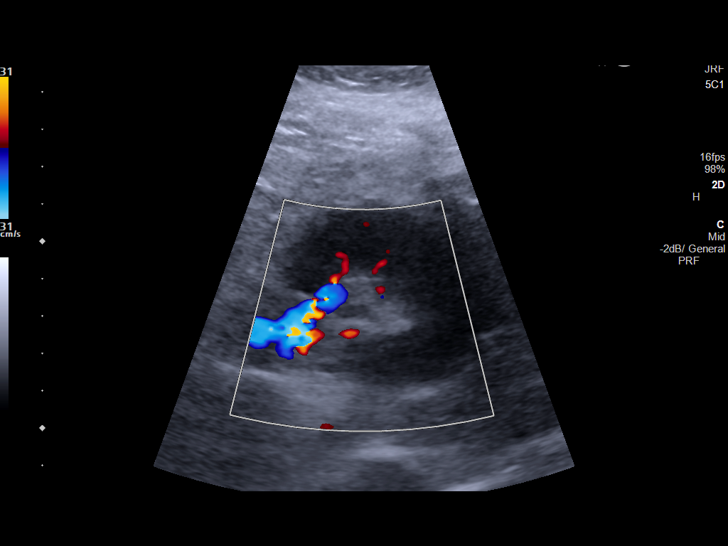
[im 105/111]
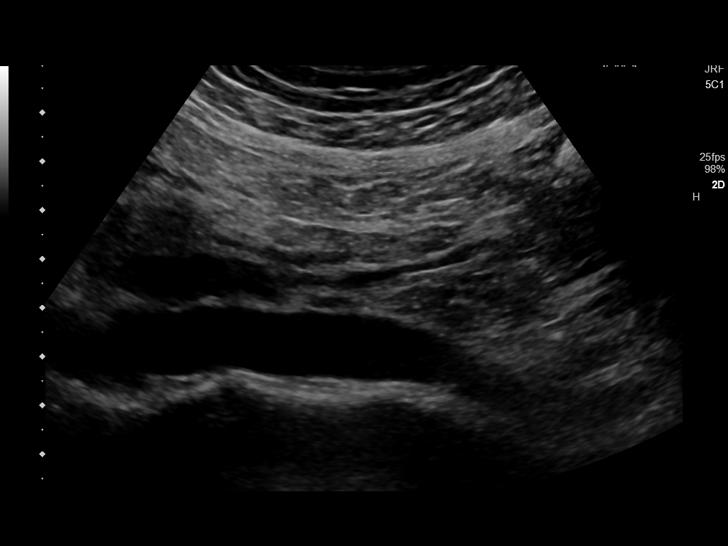

[Series 1001: us abdomen complete w/ elastography · 2 of 14 slices shown (2 of 2)]
[im 1/14]
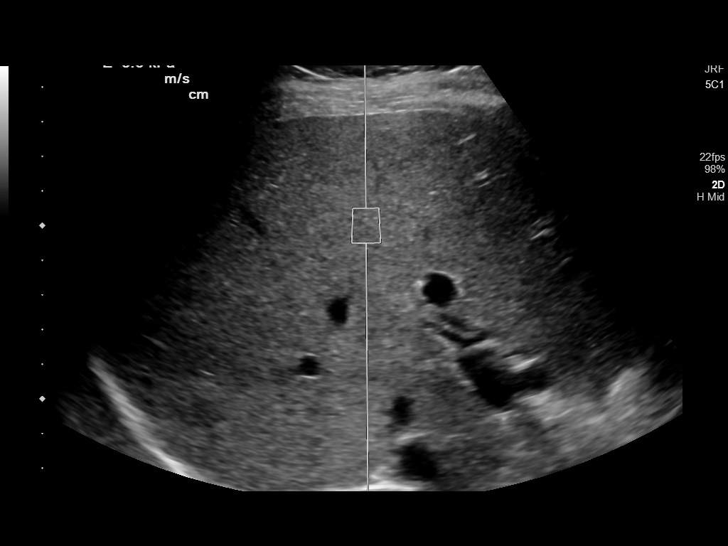
[im 14/14]
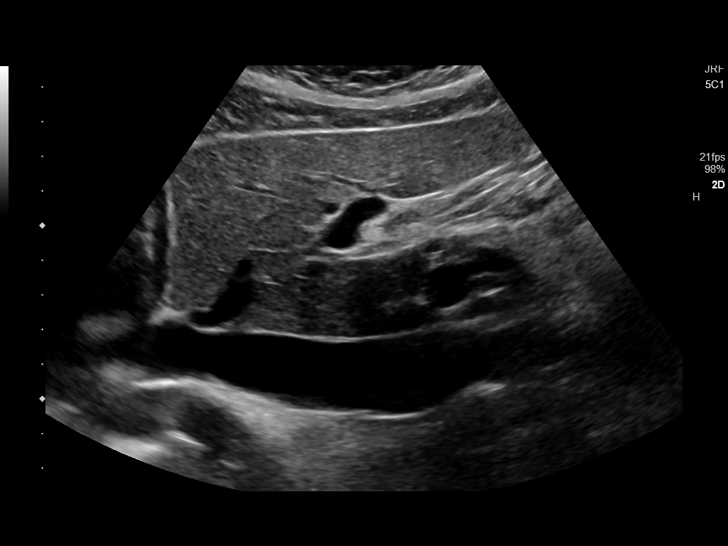

[12 of 25 positions shown; findings below may reference images not displayed]

FINDINGS: ULTRASOUND ABDOMEN

Gallbladder: Normally distended without stones or wall thickening.
No pericholecystic fluid or sonographic Murphy sign.

Common bile duct: Diameter: 4 mm, normal

Liver: Upper normal echogenicity without mass or nodularity. No
intrahepatic biliary dilatation. Portal vein is patent on color
Doppler imaging with normal direction of blood flow towards the
liver.

IVC: Normal appearance

Pancreas: Normal appearance

Spleen: Normal appearance, 10.5 cm length

Right Kidney: Length: 12.4 cm. Normal morphology without mass or
hydronephrosis.

Left Kidney: Length: 11.9 cm. Normal morphology without mass or
hydronephrosis.

Abdominal aorta: Normal caliber

Other findings: No free fluid

ULTRASOUND HEPATIC ELASTOGRAPHY

Device: Siemens Helix VTQ

Patient position: Supine

Transducer [id number]

Number of measurements: 10

Hepatic segment:  8

Median kPa:

IQR:

IQR/Median kPa ratio:

Data quality:  Good

Diagnostic category:  ?5 kPa: high probability of being normal
IMPRESSION: ULTRASOUND ABDOMEN:

Normal exam.

ULTRASOUND HEPATIC ELASTOGRAPHY:

Median kPa: 3.4 kPa

Diagnostic category:  ?5 kPa: high probability of being normal

The use of hepatic elastography is applicable to patients with viral
hepatitis and non-alcoholic fatty liver disease. At this time, there
is insufficient data for the referenced cut-off values and use in
other causes of liver disease, including alcoholic liver disease.
Patients, however, may be assessed by elastography and serve as
their own reference standard/baseline.

In patients with non-alcoholic liver disease, the values suggesting
compensated advanced chronic liver disease (cACLD) may be lower, and
patients may need additional testing with elasticity results of [DATE]
kPa.

Please note that abnormal hepatic elasticity and shear wave
velocities may also be identified in clinical settings other than
with hepatic fibrosis, such as: acute hepatitis, elevated right
heart and central venous pressures including use of beta blockers,
Aggie disease (Zweygberg), infiltrative processes such as
mastocytosis/amyloidosis/infiltrative tumor/lymphoma, extrahepatic
cholestasis, with hyperemia in the post-prandial state, and with
liver transplantation. Correlation with patient history, laboratory
data, and clinical condition recommended.

Diagnostic Categories:

?5 kPa: high probability of being normal

?9 kPa: in the absence of other known clinical signs, rules [DATE] kPa and ?13 kPa: suggestive of cACLD, but needs further testing

>13 kPa: highly suggestive of cACLD

?17 kPa: highly suggestive of cACLD with an increased probability of
clinically significant portal hypertension

## 2021-09-11 LAB — CHLAMYDIA/GONOCOCCUS/TRICHOMONAS, NAA
Chlamydia by NAA: NEGATIVE
Gonococcus by NAA: NEGATIVE
Trich vag by NAA: NEGATIVE

## 2021-09-12 ENCOUNTER — Encounter: Payer: Self-pay | Admitting: Surgery

## 2021-09-12 ENCOUNTER — Ambulatory Visit (INDEPENDENT_AMBULATORY_CARE_PROVIDER_SITE_OTHER): Payer: Medicaid Other | Admitting: Surgery

## 2021-09-12 VITALS — BP 135/93 | HR 103 | Temp 98.4°F | Ht 63.0 in | Wt 173.0 lb

## 2021-09-12 DIAGNOSIS — K429 Umbilical hernia without obstruction or gangrene: Secondary | ICD-10-CM

## 2021-09-12 DIAGNOSIS — Z09 Encounter for follow-up examination after completed treatment for conditions other than malignant neoplasm: Secondary | ICD-10-CM | POA: Diagnosis not present

## 2021-09-12 NOTE — Patient Instructions (Signed)

## 2021-09-12 NOTE — Progress Notes (Signed)
[facility] POST-OP OFFICE VISIT  09/12/2021  HPI: Margaret Krueger is a 31 y.o. female 25 days s/p periumbilical ventral hernia repair.  She denies any history of pain or discomfort.  She reports she is doing well.  Vital signs: BP (!) 135/93   Pulse (!) 103   Temp 98.4 F (36.9 C)   Ht 5\' 3"  (1.6 m)   Wt 173 lb (78.5 kg)   LMP 09/04/2021 (Exact Date)   SpO2 98%   BMI 30.65 kg/m    Physical Exam: Constitutional: 09/06/2021 present as chaperone.  She appears well without evidence of pain or tenderness. Abdomen: Soft benign nontender.  No evidence of induration, or adjacent abdominal wall tenderness. Skin: Healing nicely, umbilical defect is now a wide-based concavity.  She seems quite pleased cosmetically with the results, and hopes that we will remain that way.  Assessment/Plan: This is a 31 y.o. female 25 days s/p ventral hernia repair.  Patient Active Problem List   Diagnosis Date Noted   Grade I hemorrhoids 08/04/2021   Umbilical hernia without obstruction and without gangrene 08/04/2021   Iron deficiency anemia 05/08/2019   Uterine contractions during pregnancy 01/15/2019   Chronic hepatitis C without hepatic coma (HCC) 12/30/2018   Methadone maintenance treatment affecting pregnancy, antepartum (HCC) 11/27/2018   Anemia during pregnancy 11/27/2018   Supervision of high risk pregnancy, antepartum 11/20/2018    -Removed her weight lifting restrictions, and her date of return to work.  She will follow-up as needed.   11/22/2018 M.D., FACS 09/12/2021, 10:41 AM

## 2021-12-08 ENCOUNTER — Other Ambulatory Visit (HOSPITAL_COMMUNITY)
Admission: RE | Admit: 2021-12-08 | Discharge: 2021-12-08 | Disposition: A | Discharge status: Home / Self Care | Payer: Medicaid Other | Source: Ambulatory Visit | Attending: Licensed Practical Nurse | Admitting: Licensed Practical Nurse

## 2021-12-08 ENCOUNTER — Ambulatory Visit (INDEPENDENT_AMBULATORY_CARE_PROVIDER_SITE_OTHER): Payer: Medicaid Other | Admitting: Licensed Practical Nurse

## 2021-12-08 ENCOUNTER — Encounter: Payer: Self-pay | Admitting: Licensed Practical Nurse

## 2021-12-08 VITALS — BP 130/80 | Ht 63.0 in | Wt 175.0 lb

## 2021-12-08 DIAGNOSIS — Z113 Encounter for screening for infections with a predominantly sexual mode of transmission: Secondary | ICD-10-CM | POA: Diagnosis present

## 2021-12-08 DIAGNOSIS — Z124 Encounter for screening for malignant neoplasm of cervix: Secondary | ICD-10-CM | POA: Diagnosis present

## 2021-12-08 DIAGNOSIS — R5383 Other fatigue: Secondary | ICD-10-CM

## 2021-12-08 DIAGNOSIS — Z3042 Encounter for surveillance of injectable contraceptive: Secondary | ICD-10-CM

## 2021-12-08 DIAGNOSIS — Z01419 Encounter for gynecological examination (general) (routine) without abnormal findings: Secondary | ICD-10-CM | POA: Insufficient documentation

## 2021-12-08 DIAGNOSIS — Z3202 Encounter for pregnancy test, result negative: Secondary | ICD-10-CM

## 2021-12-08 DIAGNOSIS — Z131 Encounter for screening for diabetes mellitus: Secondary | ICD-10-CM

## 2021-12-08 DIAGNOSIS — Z13220 Encounter for screening for lipoid disorders: Secondary | ICD-10-CM

## 2021-12-08 LAB — POCT URINE PREGNANCY: Preg Test, Ur: NEGATIVE

## 2021-12-08 MED ORDER — MEDROXYPROGESTERONE ACETATE 150 MG/ML IM SUSP
150.0000 mg | Freq: Once | INTRAMUSCULAR | Status: AC
  Administered 2021-12-08: 150 mg via INTRAMUSCULAR

## 2021-12-08 NOTE — Progress Notes (Signed)
Gynecology Annual Exam   PCP: Armando Gang, FNP  Chief Complaint:  Chief Complaint  Patient presents with  . Gynecologic Exam    Depo inj today    History of Present Illness: Patient is a 31 y.o. G1P1001 presents for annual exam. The patient is concerned for feeling tired and having a change in appetite. She also has spotting after sex.  Fatigue: over the last 4 months, works two jobs, gets 7-8 hours of sleep per night, does not exercise Change in appetite: over the last 2 months, does not always feel hungry, does eat, but admits to eating junk.  Denies changes in her bowel habits, recently had surgery for a hernia, has imaging done and they told her gut looks irritated, she normally has loose stools.    LMP: No LMP recorded. Patient has had an injection. But did bleed for 2 weeks recently  Postcoital Bleeding: yes. Last episode 2 weeks ago Dysmenorrhea: no  The patient is sexually active with 1 female partner. She currently uses Depo-Provera injections for contraception. She denies dyspareunia.  The patient does not perform self breast exams.  There is no notable family history of breast or ovarian cancer in her family.  The patient wears seatbelts: yes.   The patient has regular exercise: no.    The patient denies current symptoms of depression.  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 Negative for chills, diaphoresis, fever and weight loss.  Eyes: Negative.   Respiratory: Negative.    Cardiovascular: Negative.    Gastrointestinal: Negative.   Musculoskeletal:  Positive for joint pain.  Neurological: Negative.   Endo/Heme/Allergies: Negative.   Psychiatric/Behavioral:  The patient is nervous/anxious.     Past Medical History:  Patient Active Problem List   Diagnosis Date Noted  . Status post umbilical hernia repair, follow-up exam 09/12/2021  . Grade I hemorrhoids 08/04/2021  . Umbilical hernia without obstruction and without gangrene 08/04/2021  . Iron deficiency anemia 05/08/2019  . Chronic hepatitis C without hepatic coma (HCC) 12/30/2018     Dx in 2015. 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  GT: pending.  Initial RNA: [id number].  Hep B: Reportedly negative in pregnancy (delivered 01/2019). Records pending.  Hep A: Reportedly negative in pregnancy (delivered 01/2019). Records pending.  HIV: Reportedly negative in pregnancy (delivered 01/2019). Records pending.  Imaging: pending.  Fibroscan: pending.  Tx: pending.   . Methadone maintenance treatment affecting pregnancy, antepartum (HCC) 11/27/2018  . Anemia during pregnancy 11/27/2018  . Supervision of high risk pregnancy, antepartum 11/20/2018    [facility] Prenatal Labs  Dating By early u/s Blood type: AB+  Genetic Screen 1 Screen:    AFP:     Quad:     NIPS: Antibody: negative  Anatomic [place] Complete 8/17 Rubella: Immune, Varicella: Immune  GTT Early:               Third trimester:  RPR: Non-reactive   Rhogam  not needed HBsAg: Negative  TDaP vaccine  11/27/2018  Flu Shot: HIV: Non-reactive   Baby Food Breast                               GBS:   Contraception Interested in pill Pap: 11/20/18  CBB     CS/VBAC    Support Person Al Corpus         Past Surgical History:  Past Surgical History:  Procedure Laterality Date  . DENTAL SURGERY     2021  . elbow sugery Right 1998  . UMBILICAL HERNIA REPAIR N/A 08/18/2021   Procedure: HERNIA REPAIR UMBILICAL ADULT;  Surgeon: Campbell Lerner, MD;  Location: [facility] ORS;  Service: General;  Laterality: N/A;    Gynecologic History:  No LMP recorded. Patient has had an injection. Contraception: Depo-Provera injections Last Pap: Results were: no abnormalities 2020  Obstetric History: G1P1001  Family History:  Family History  Problem Relation Age of Onset  . Leukemia Mother     Social History:  Social History   Socioeconomic History  . Marital status: Single    Spouse name: Not on file  . Number of children: Not on file  . Years of education: Not on file  . Highest education level: Not on file  Occupational History  . Not on file  Tobacco Use  . Smoking status: Former    Types: Cigarettes    Passive exposure: Past  . Smokeless tobacco: Never  Vaping Use  . Vaping Use: Every day  . Substances: Nicotine, Flavoring  Substance and Sexual Activity  . Alcohol use: Not Currently  . Drug use: Not Currently    Types: Heroin  . Sexual activity: Yes    Birth control/protection: Injection  Other Topics Concern  . Not on file  Social History Narrative  . Not on file   Social Determinants of Health   Financial Resource Strain: Not on file  Food Insecurity: Not on file  Transportation Needs: Not on file  Physical Activity: Not on file  Stress: Not on file  Social Connections: Not on file  Intimate Partner Violence: Not on file    Allergies:  No Known Allergies  Medications: Prior to Admission medications   Medication Sig Start Date End Date Taking? Authorizing Provider  medroxyPROGESTERone (DEPO-PROVERA) 150 MG/ML injection Inject 150 mg into the muscle every 3 (three) months.   Yes [provider]    Physical Exam Vitals: Blood pressure 130/80, height 5\' 3"  (1.6 m), weight 175 lb (79.4 kg), not currently breastfeeding.  General: NAD HEENT: normocephalic, anicteric Thyroid: no enlargement, no palpable nodules Pulmonary: No increased work of breathing, CTAB Cardiovascular: RRR, distal  pulses 2+ Breast: Breast symmetrical, no tenderness, no palpable nodules or masses, no skin or nipple retraction present, no nipple discharge.  No axillary or supraclavicular lymphadenopathy. Abdomen: NABS, soft, non-tender, non-distended.  Umbilicus without lesions.  No hepatomegaly, splenomegaly or masses palpable. No evidence of hernia  Genitourinary:  External: Normal external female genitalia.  Normal urethral meatus, normal Bartholin's and Skene's glands.    Vagina: Normal vaginal mucosa, no evidence of prolapse.    Cervix: Grossly normal in appearance, no bleeding  Uterus: Non-enlarged, mobile, normal contour.  No CMT  Adnexa: ovaries non-enlarged, no adnexal masses  Rectal: deferred  Lymphatic: no evidence of inguinal lymphadenopathy Extremities: no edema, erythema, or tenderness Neurologic: Grossly intact Psychiatric: mood appropriate, affect full   Assessment: 31 y.o. G1P1001 routine  annual exam  Plan: Problem List Items Addressed This Visit   None   2) STI screening  wasoffered and accepted  2)  ASCCP guidelines and rational discussed.  Patient opts for every 5 years screening interval  3) Contraception - the patient is currently using  Depo-Provera injections.  She is happy with her current form of contraception and plans to continue  4) Routine healthcare maintenance including cholesterol, diabetes screening discussed Ordered today.  Will call with results  5) No follow-ups on file.  6) Spotting after IC: will see what happens after 1 more round of Depo, no obvious reasons for bleeding today. May consider pelvic US in the future.  Carie Caddy, CNM  [facility], [facility] 12/08/2021, 8:33 AM

## 2021-12-12 LAB — CYTOLOGY - PAP
Chlamydia: NEGATIVE
Comment: NEGATIVE
Comment: NEGATIVE
Comment: NEGATIVE
Comment: NORMAL
Diagnosis: NEGATIVE
High risk HPV: NEGATIVE
Neisseria Gonorrhea: NEGATIVE
Trichomonas: NEGATIVE

## 2022-01-23 ENCOUNTER — Telehealth: Payer: Self-pay

## 2022-01-23 NOTE — Telephone Encounter (Signed)
Pt calling triage c/o heavy bleeding for 2 weeks with the depo, having huge clots "the size of an egg". She is wanting an appt scheduled for a birth control consult to possibly change her BC. Please help get an appt scheduled for pt.

## 2022-01-25 NOTE — Telephone Encounter (Signed)
Patient is scheduled for 01/26/22 with LMD 

## 2022-01-26 ENCOUNTER — Ambulatory Visit: Payer: Medicaid Other | Admitting: Licensed Practical Nurse

## 2022-03-08 ENCOUNTER — Ambulatory Visit: Payer: Medicaid Other

## 2022-03-08 ENCOUNTER — Ambulatory Visit (INDEPENDENT_AMBULATORY_CARE_PROVIDER_SITE_OTHER): Payer: Medicaid Other

## 2022-03-08 VITALS — BP 111/79 | HR 91 | Resp 16 | Ht 63.0 in | Wt 175.3 lb

## 2022-03-08 DIAGNOSIS — Z3042 Encounter for surveillance of injectable contraceptive: Secondary | ICD-10-CM | POA: Diagnosis not present

## 2022-03-08 DIAGNOSIS — Z3202 Encounter for pregnancy test, result negative: Secondary | ICD-10-CM | POA: Diagnosis not present

## 2022-03-08 LAB — POCT URINE PREGNANCY: Preg Test, Ur: NEGATIVE

## 2022-03-08 MED ORDER — MEDROXYPROGESTERONE ACETATE 150 MG/ML IM SUSP
150.0000 mg | Freq: Once | INTRAMUSCULAR | Status: AC
  Administered 2022-03-08: 150 mg via INTRAMUSCULAR

## 2022-03-08 NOTE — Progress Notes (Cosign Needed Addendum)
    NURSE VISIT NOTE  Subjective:    Patient ID: Margaret Krueger, female    DOB: April 19, 1991, 31 y.o.   MRN: [id number]  HPI  Patient is a 31 y.o. G74P1001 female who presents for surveillance of depo provera injection. She has concerns about her breast being sore and wants to take a pregnancy test to be sure she is not pregnant. I tried to reassure her that she was not pregnant on the depo, but I did a urine HCG test just to give her a piece of mind.   Date last pap: 12/08/2021. Last Depo-Provera: 12/08/2021. Side Effects if any: None. Serum HCG indicated? N/A. Depo-Provera 150 mg IM given by: Santiago Bumpers, CMA. Next appointment due: Feb.1 - Feb.15   The following portions of the patient's history were reviewed and updated as appropriate: allergies, current medications, past family history, past medical history, past social history, past surgical history, and problem list.  Review of Systems Pertinent items are noted in HPI.   Objective:   Blood pressure 111/79, pulse 91, resp. rate 16, height 5\' 3"  (1.6 m), weight 175 lb 4.8 oz (79.5 kg). Body mass index is 31.05 kg/m. General appearance: alert, cooperative, and no distress   Assessment:   1. Encounter for surveillance of injectable contraceptive      Plan:   Follow up in 3 months for next depo injection. (Feb.1 - Feb. 15)    01-28-1992, CMA [facility]

## 2022-03-08 NOTE — Patient Instructions (Incomplete)

## 2022-05-30 ENCOUNTER — Ambulatory Visit: Payer: Medicaid Other

## 2022-05-30 NOTE — Progress Notes (Deleted)
Date last pap: 04/09/22. Last Depo-Provera: 03/08/22. Side Effects if any: none. Serum HCG indicated? no. Depo-Provera 150 mg IM given by: Quintella Baton Cma. Next appointment due 08/16/22-08/30/22.

## 2022-06-06 ENCOUNTER — Ambulatory Visit (INDEPENDENT_AMBULATORY_CARE_PROVIDER_SITE_OTHER): Payer: Medicaid Other

## 2022-06-06 VITALS — BP 120/80 | Ht 63.0 in | Wt 188.0 lb

## 2022-06-06 DIAGNOSIS — Z3042 Encounter for surveillance of injectable contraceptive: Secondary | ICD-10-CM

## 2022-06-06 MED ORDER — MEDROXYPROGESTERONE ACETATE 150 MG/ML IM SUSP
150.0000 mg | Freq: Once | INTRAMUSCULAR | Status: AC
  Administered 2022-06-06: 150 mg via INTRAMUSCULAR

## 2022-06-06 NOTE — Progress Notes (Addendum)
Date last pap: 12/08/21. Last Depo-Provera: 03/08/22. Side Effects if any: none. Serum HCG indicated? no. Depo-Provera 150 mg IM given by: Quintella Baton cma. Next appointment due 08/23/2022-09/06/2022.

## 2022-06-06 NOTE — Patient Instructions (Signed)
Contraceptive Injection A contraceptive injection is a shot that prevents pregnancy. It is also called a birth control shot. The shot contains the hormone progestin, which prevents pregnancy by: Stopping the ovaries from releasing eggs. Thickening cervical mucus to prevent sperm from entering the cervix. Thinning the lining of the uterus to prevent a fertilized egg from attaching to the uterus. Contraceptive injections are given under the skin (subcutaneous) or into a muscle (intramuscular). For these shots to work, you must get one of them every 3 months (12-13 weeks) from a health care provider. Tell a health care provider about: Any allergies you have. All medicines you are taking, including vitamins, herbs, eye drops, creams, and over-the-counter medicines. Any blood disorders you have. Any medical conditions you have. Whether you are pregnant or may be pregnant. What are the risks? Generally, this is a safe procedure. However, problems may occur, including: Mood changes or depression. Loss of bone density (osteoporosis) after long-term use. Blood clots. This is rare. Higher risk of an egg being fertilized outside your uterus (ectopic pregnancy).This is rare. What happens before the procedure? Your health care provider may do a routine physical exam. You may have a test to make sure you are not pregnant. What happens during the procedure?  The area where the shot will be given will be cleaned and sanitized with alcohol. A needle will be inserted into a muscle in your upper arm or buttock, or into the skin of your thigh or abdomen. The needle will be attached to a syringe with the medicine inside of it. The medicine will be pushed through the syringe and injected into your body. A small bandage (dressing) may be placed over the injection site. What can I expect after the procedure? After the procedure, it is common to have: Soreness around the injection site for a couple of  days. Irregular menstrual bleeding. Weight gain. Breast tenderness. Headaches. Discomfort in your abdomen. Ask your health care provider whether you need to use an added method of birth control (backup contraception), such as a condom, sponge, or spermicide. If the first shot is given 1-7 days after the start of your last menstrual period, you will not need backup contraception. If the first shot is given at any other time during your menstrual cycle, you should avoid having sex. If you do have sex, you will need to use backup contraception for 7 days after you receive the shot. Follow these instructions at home: General instructions Take over-the-counter and prescription medicines only as told by your health care provider. Do not rub or massage the injection site. Track your menstrual periods so you will know if they become irregular. Always use a condom to protect against sexually transmitted infections (STIs). Make sure you schedule an appointment in time for your next shot and mark it on your calendar. You must get an injection every 3 months (12-13 weeks) to prevent pregnancy. Lifestyle Do not use any products that contain nicotine or tobacco. These products include cigarettes, chewing tobacco, and vaping devices, such as e-cigarettes. If you need help quitting, ask your health care provider. Eat foods that are high in calcium and vitamin D, such as milk, cheese, and salmon. Doing this may help with any loss in bone density caused by the contraceptive injection. Ask your health care provider for dietary recommendations. Contact a health care provider if you: Have nausea or vomiting. Have abnormal vaginal discharge or bleeding. Miss a menstrual period or think you might be pregnant. Experience mood changes  or depression. Feel dizzy or light-headed. Have leg pain. Get help right away if you: Have chest pain or cough up blood. Have shortness of breath. Have a severe headache that does  not go away. Have numbness in any part of your body. Have slurred speech or vision problems. Have vaginal bleeding that is abnormally heavy or does not stop, or you have severe pain in your abdomen. Have depression that does not get better with treatment. If you ever feel like you may hurt yourself or others, or have thoughts about taking your own life, get help right away. Go to your nearest emergency department or: Call your local emergency services (911 in the [place] Call a suicide crisis helpline, such as the [place] at (218)064-7834 or 988 in the [place]. This is open 24 hours a day in the [place] Text the Crisis Text Line at (435)513-2000 (in the [place].). Summary A contraceptive injection is a shot that prevents pregnancy. It is also called the birth control shot. The shot is given under the skin (subcutaneous) or into a muscle (intramuscular). After this procedure, it is common to have soreness around the injection site for a couple of days. To prevent pregnancy, the shot must be given by a health care provider every 3 months (12-13 weeks). After you have the shot, ask your health care provider whether you need to use an added method of birth control (backup contraception), such as a condom, sponge, or spermicide. This information is not intended to replace advice given to you by your health care provider. Make sure you discuss any questions you have with your health care provider. Document Revised: 11/02/2020 Document Reviewed: 10/19/2019 Elsevier Patient Education  [place].

## 2022-07-09 ENCOUNTER — Emergency Department
Admission: EM | Admit: 2022-07-09 | Discharge: 2022-07-09 | Disposition: A | Discharge status: Home / Self Care | Payer: Medicaid Other | Attending: Emergency Medicine | Admitting: Emergency Medicine

## 2022-07-09 ENCOUNTER — Other Ambulatory Visit: Payer: Self-pay

## 2022-07-09 DIAGNOSIS — R030 Elevated blood-pressure reading, without diagnosis of hypertension: Secondary | ICD-10-CM | POA: Insufficient documentation

## 2022-07-09 DIAGNOSIS — Z1152 Encounter for screening for COVID-19: Secondary | ICD-10-CM | POA: Diagnosis not present

## 2022-07-09 DIAGNOSIS — J029 Acute pharyngitis, unspecified: Secondary | ICD-10-CM | POA: Diagnosis present

## 2022-07-09 DIAGNOSIS — J069 Acute upper respiratory infection, unspecified: Secondary | ICD-10-CM | POA: Insufficient documentation

## 2022-07-09 LAB — RESP PANEL BY RT-PCR (RSV, FLU A&B, COVID)  RVPGX2
Influenza A by PCR: NEGATIVE
Influenza B by PCR: NEGATIVE
Resp Syncytial Virus by PCR: NEGATIVE
SARS Coronavirus 2 by RT PCR: NEGATIVE

## 2022-07-09 LAB — GROUP A STREP BY PCR: Group A Strep by PCR: NOT DETECTED

## 2022-07-09 NOTE — ED Triage Notes (Signed)
Pt to ED for sore throat, headache for the past few days. States unsure if its allergies

## 2022-07-09 NOTE — ED Notes (Signed)
Pt declines covid test, states she took one the other day

## 2022-07-09 NOTE — Discharge Instructions (Signed)
Call and make an appointment with your primary care provider to have your blood pressure reevaluated as it was elevated in the emergency department at 122/106.  If this continues to stay elevated you may need to be treated for hypertension.  Increase fluids and stay hydrated.  Also Tylenol or ibuprofen if needed for fever, headache, throat pain.  Flonase nasal spray works for allergies and will not increase your blood pressure.  Avoid any medications that have pseudoephedrine in it as this will increase your blood pressure.

## 2022-07-09 NOTE — ED Provider Notes (Signed)
[facility] Provider Note    Event Date/Time   First MD Initiated Contact with Patient 07/09/22 [phone]     (approximate)   History   Sore Throat   HPI  Margaret Krueger is a 32 y.o. female presents to the ED with complaint of sore throat and headache for the last few days.  Patient is unsure if it is allergies or whether she is coming down with strep throat as she has small children at home.     Physical Exam   Triage Vital Signs: ED Triage Vitals  Enc Vitals Group     BP 07/09/22 0950 (!) 122/106     Pulse Rate 07/09/22 0950 (!) 110     Resp 07/09/22 0949 16     Temp 07/09/22 0949 97.8 F (36.6 C)     Temp src --      SpO2 07/09/22 0950 95 %     Weight 07/09/22 0950 180 lb (81.6 kg)     Height 07/09/22 0950 5\' 3"  (1.6 m)     Head Circumference --      Peak Flow --      Pain Score 07/09/22 0950 0     Pain Loc --      Pain Edu? --      Excl. in [place]? --     Most recent vital signs: Vitals:   07/09/22 0950 07/09/22 1119  BP: (!) 122/106 (!) 143/73  Pulse: (!) 110   Resp:    Temp:    SpO2: 95%      General: Awake, no distress.  CV:  Good peripheral perfusion.  Resp:  Normal effort.  Abd:  No distention.  Other:  Minimal nasal congestion.  Posterior pharynx without erythema no exudate is noted.  Uvula is midline.  Neck supple without cervical lymphadenopathy.   ED Results / Procedures / Treatments   Labs (all labs ordered are listed, but only abnormal results are displayed) Labs Reviewed  RESP PANEL BY RT-PCR (RSV, FLU A&B, COVID)  [id number]  GROUP A STREP BY PCR       PROCEDURES:  Critical Care performed:   Procedures   MEDICATIONS ORDERED IN ED: Medications - No data to display   IMPRESSION / MDM / [place] / ED COURSE  I reviewed the triage vital signs and the nursing notes.   Differential diagnosis includes, but is not limited to, viral URI, strep pharyngitis, influenza, COVID, RSV, elevated blood  pressure.  32 year old female presents to the ED with complaint of sore throat and headache for the last several days.  Patient denies any known sick exposures.  She does have small children at home.  Physical findings were benign and swabs were reassuring with COVID, influenza, RSV and strep being negative.  Patient was made aware and encouraged to drink fluids frequently.  Follow-up with her PCP for her elevated blood pressure and to avoid any medications over-the-counter that contain pseudoephedrine which would cause her blood pressure to elevate.      Patient's presentation is most consistent with acute complicated illness / injury requiring diagnostic workup.  FINAL CLINICAL IMPRESSION(S) / ED DIAGNOSES   Final diagnoses:  Viral URI  Elevated blood pressure reading     Rx / DC Orders   ED Discharge Orders     None        Note:  This document was prepared using Dragon voice recognition software and may include unintentional dictation errors.   Letitia Neri  L, PA-C 07/09/22 1200    Lavonia Drafts, MD 07/09/22 1446

## 2022-08-28 NOTE — Progress Notes (Unsigned)
    NURSE VISIT NOTE  Subjective:    Patient ID: Margaret Krueger, female    DOB: Apr 04, 1991, 32 y.o.   MRN: [id number]  HPI  Patient is a 32 y.o. G42P1001 female who presents for depo provera injection.   Objective:    There were no vitals taken for this visit.  Last Annual: ***. Last pap: ***. Last Depo-Provera: ***. Side Effects if any: {NONE:21772}***. Serum HCG indicated? {YES/NO:21197}. Depo-Provera 150 mg IM given by: {AOB Clinical WUJWJ:19147}. Site: {AOB INJ [id number]  Lab Review  @THIS  VISIT ONLY@  Assessment:   No diagnosis found.   Plan:   Next appointment due between May 24-Jun 7    Loney Laurence, [place]

## 2022-08-29 ENCOUNTER — Ambulatory Visit: Payer: Medicaid Other

## 2022-08-29 DIAGNOSIS — Z3042 Encounter for surveillance of injectable contraceptive: Secondary | ICD-10-CM

## 2022-10-10 ENCOUNTER — Encounter: Payer: Self-pay | Admitting: Licensed Practical Nurse

## 2022-10-10 ENCOUNTER — Ambulatory Visit: Payer: Medicaid Other | Admitting: Licensed Practical Nurse

## 2022-10-10 VITALS — BP 120/91 | HR 94 | Ht 63.0 in | Wt 196.2 lb

## 2022-10-10 DIAGNOSIS — N912 Amenorrhea, unspecified: Secondary | ICD-10-CM | POA: Diagnosis not present

## 2022-10-10 DIAGNOSIS — Z3202 Encounter for pregnancy test, result negative: Secondary | ICD-10-CM

## 2022-10-10 DIAGNOSIS — N644 Mastodynia: Secondary | ICD-10-CM | POA: Diagnosis not present

## 2022-10-10 DIAGNOSIS — Z3042 Encounter for surveillance of injectable contraceptive: Secondary | ICD-10-CM | POA: Diagnosis not present

## 2022-10-10 MED ORDER — MEDROXYPROGESTERONE ACETATE 150 MG/ML IM SUSY
150.0000 mg | PREFILLED_SYRINGE | Freq: Once | INTRAMUSCULAR | Status: AC
  Administered 2022-10-10: 150 mg via INTRAMUSCULAR

## 2022-10-10 NOTE — Progress Notes (Signed)
SUBJECTIVE: Here to discuss birth control and breast pain   Birth Control: has been on Depo, missed her last shot d/t being out of town. Has decided she wants to keep with Depo as she does not want any device in her and would not remember to take a pill. She gets heavy bleeding for 30 mins about every 1-2 months.  She is sexually active with 1 female, she does not use condoms. She does not desire a pregnancy  Breast pain: has had bilateral breast (but mostly on the upper outer quadrants) pain for the last month, they are tender to touch. Her bra is tight fitting. Denies any other changes to her breasts.    OBJECTIVE: BP (!) 120/91   Pulse 94   Ht 5\' 3"  (1.6 m)   Wt 196 lb 3.2 oz (89 kg)   BMI 34.76 kg/m  Gen: NAD Breasts: soft, no redness or masses. Right nipple more inverted than the left nipple-pt states her nipples have always looked this way. No tenderness with palpation   UPT negative  ASSESSMENT: -Need for contraception -Breast pain, bilateral  PLAN: -Depo given today -rec switching to a well fitting bra, Imaging offered, pt prefers to wait and see, will send message if she desires imaging.  -Pt will schedule Annual exam for August   Carie Caddy, [place]  [facility]  10/10/22  11:52 AM

## 2022-10-15 LAB — POCT URINE PREGNANCY: Preg Test, Ur: NEGATIVE

## 2022-11-27 DIAGNOSIS — M7632 Iliotibial band syndrome, left leg: Secondary | ICD-10-CM | POA: Insufficient documentation

## 2023-01-02 ENCOUNTER — Ambulatory Visit (INDEPENDENT_AMBULATORY_CARE_PROVIDER_SITE_OTHER): Payer: Medicaid Other

## 2023-01-02 VITALS — BP 125/85 | HR 78 | Ht 63.0 in | Wt 199.0 lb

## 2023-01-02 DIAGNOSIS — Z3042 Encounter for surveillance of injectable contraceptive: Secondary | ICD-10-CM | POA: Insufficient documentation

## 2023-01-02 MED ORDER — MEDROXYPROGESTERONE ACETATE 150 MG/ML IM SUSP
150.0000 mg | Freq: Once | INTRAMUSCULAR | Status: AC
  Administered 2023-01-02: 150 mg via INTRAMUSCULAR

## 2023-01-02 NOTE — Patient Instructions (Signed)

## 2023-01-02 NOTE — Progress Notes (Signed)
    NURSE VISIT NOTE  Subjective:    Patient ID: Margaret Krueger, female    DOB: 1990-10-24, 32 y.o.   MRN: [id number]  HPI  Patient is a 32 y.o. G97P1001 female who presents for depo provera injection.   Objective:    BP 125/85   Pulse 78   Ht 5\' 3"  (1.6 m)   Wt 199 lb (90.3 kg)   BMI 35.25 kg/m   Last Annual: 12/08/21 Last pap: 12/08/21. Last Depo-Provera: 10/10/22. Side Effects if any: none. Serum HCG indicated? No . Depo-Provera 150 mg IM given by: Rocco Serene, LPN. Site: Left Deltoid   Assessment:   1. Encounter for surveillance of injectable contraceptive      Plan:   Next Depo due between 12/7 and 04/13/23. Patient to discuss discontinuing depo and starting oral contraceptives at annual scheduled 01/07/23.    Rocco Serene, LPN

## 2023-01-07 ENCOUNTER — Ambulatory Visit (INDEPENDENT_AMBULATORY_CARE_PROVIDER_SITE_OTHER): Payer: Medicaid Other

## 2023-01-07 ENCOUNTER — Other Ambulatory Visit (HOSPITAL_COMMUNITY)
Admission: RE | Admit: 2023-01-07 | Discharge: 2023-01-07 | Disposition: A | Discharge status: Home / Self Care | Payer: Medicaid Other | Source: Ambulatory Visit

## 2023-01-07 VITALS — BP 116/81 | HR 96 | Ht 63.0 in | Wt 198.7 lb

## 2023-01-07 DIAGNOSIS — N760 Acute vaginitis: Secondary | ICD-10-CM

## 2023-01-07 DIAGNOSIS — Z113 Encounter for screening for infections with a predominantly sexual mode of transmission: Secondary | ICD-10-CM

## 2023-01-07 DIAGNOSIS — Z01419 Encounter for gynecological examination (general) (routine) without abnormal findings: Secondary | ICD-10-CM | POA: Diagnosis not present

## 2023-01-07 NOTE — Progress Notes (Signed)
Outpatient Gynecology Note: Annual Visit  Assessment/Plan:    Margaret Krueger is a 32 y.o. female G1P1001 with normal well-woman gynecologic exam.   Well woman exam - Reviewed health maintenance topics as documented below. - Most recent pap smear in 2023, repeat cervical cancer screening due in 2028 . - Satisfied with current contraception. Had some concerns about how long it was safe to not have a period. Reviewed difference between menstrual cycle bleeding and withdrawal bleeding while utilizing hormonal contraception. Discussed decreased risk of endometrial and ovarian cancers with birth control use. Considering switching to oral contraception but desires to wait at this time.  - STI screening accepted.     Orders Placed This Encounter  Procedures   HEP, RPR, HIV Panel   Current Outpatient Medications  Medication Instructions   famotidine (PEPCID) 40 MG tablet    medroxyPROGESTERone (DEPO-PROVERA) 150 mg, Intramuscular, Every 3 months   meloxicam (MOBIC) 15 MG tablet Take 1 tablet every day by oral route.    No follow-ups on file.    Subjective:    Margaret Krueger is a 32 y.o. female G1P1001 who presents for annual wellness visit.   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    Lives with boyfriend and 80 yo.     CONCERNS? No concerns, but open to discussing contraceptive options.   Well Woman Visit:  GYN HISTORY:  No LMP recorded. Patient has had an injection.     Menstrual History: OB History     Gravida  1   Para  1   Term  1   Preterm      AB      Living  1      SAB      IAB      Ectopic      Multiple  0   Live Births  1           Menarche age: 39 No LMP recorded. Patient has had an injection.   Intermenstrual bleeding, spotting, or discharge? On Depo, some spotting occasionally Urinary incontinence? no  Sexually active: yes Number of sexual partners: one Gender of sexual Partners: male Dyspareunia? no Last pap: 2023,  NILM, HPV neg  History of abnormal Pap: no Gardasil series:  unsure STI history: yes, chlamydia and gonorrhea, Hep C (has been on medication and no longer active infection) STI/HIV testing or immunizations needed? Yes.   Contraceptive methods: Depo-Provera  Health Maintenance > Reviewed breast self-awareness, no family history of breast cancer > History of abnormal mammogram: n/a > Exercise: none, not active > Dietary Supplements: Vit C > Body mass index is 35.2 kg/m.  > Recent dental visit No. > Seat Belt Use: Yes.   > Texting and driving? No. > Guns in the house No. > Concern for alcohol abuse? Occasional, once a month   Tobacco or other drug use: Vape with nicotine Tobacco Use: Medium Risk (01/07/2023)   Patient History    Smoking Tobacco Use: Former    Smokeless Tobacco Use: Never    Passive Exposure: Past     PHQ-2 Score: In last two weeks, how often have you felt: Little interest or pleasure in doing things: Several days (+1) Feeling down, depressed or hopeless: Not at all (0) Score: 1, tired due   GAD-2 Over the last 2 weeks, how often have you been bothered by the following problems? Feeling nervous, anxious or on edge: Several days (+1) Not being able to stop or control worrying:  Not at all (0)} Score: 1  _________________________________________________________  Current Outpatient Medications  Medication Sig Dispense Refill   famotidine (PEPCID) 40 MG tablet      medroxyPROGESTERone (DEPO-PROVERA) 150 MG/ML injection Inject 150 mg into the muscle every 3 (three) months.     meloxicam (MOBIC) 15 MG tablet Take 1 tablet every day by oral route.     No current facility-administered medications for this visit.   No Known Allergies  Past Medical History:  Diagnosis Date   Anemia    Bipolar 1 disorder (HCC)    Hepatitis C test positive    treated   Substance abuse [facility])    Past Surgical History:  Procedure Laterality Date   DENTAL SURGERY     2021    elbow sugery Right 1998   UMBILICAL HERNIA REPAIR N/A 08/18/2021   Procedure: HERNIA REPAIR UMBILICAL ADULT;  Surgeon: Campbell Lerner, MD;  Location: [facility] ORS;  Service: General;  Laterality: N/A;   OB History     Gravida  1   Para  1   Term  1   Preterm      AB      Living  1      SAB      IAB      Ectopic      Multiple  0   Live Births  1          Social History   Tobacco Use   Smoking status: Former    Types: Cigarettes    Passive exposure: Past   Smokeless tobacco: Never  Substance Use Topics   Alcohol use: Not Currently   Social History   Substance and Sexual Activity  Sexual Activity Yes   Birth control/protection: Injection    Immunization History  Administered Date(s) Administered   Influenza,inj,Quad PF,6+ Mos 01/07/2019   Tdap 11/27/2018     Review Of Systems  Constitutional: Denied constitutional symptoms, night sweats, recent illness, fatigue, fever, insomnia and weight loss.  Eyes: Denied eye symptoms, eye pain, photophobia, vision change and visual disturbance.  Ears/Nose/Throat/Neck: Denied ear, nose, throat or neck symptoms, hearing loss, nasal discharge, sinus congestion and sore throat.  Cardiovascular: Denied cardiovascular symptoms, arrhythmia, chest pain/pressure, edema, exercise intolerance, orthopnea and palpitations.  Respiratory: Denied pulmonary symptoms, asthma, pleuritic pain, productive sputum, cough, dyspnea and wheezing.  Gastrointestinal: Positive for diarrhea, bloating, and gas. Denied, gastro-esophageal reflux, melena, nausea and vomiting.  Genitourinary: Denied genitourinary symptoms including symptomatic vaginal discharge, pelvic relaxation issues, and urinary complaints.  Musculoskeletal: Denied musculoskeletal symptoms, stiffness, swelling, muscle weakness and myalgia.  Dermatologic: Denied dermatology symptoms, rash and scar.  Neurologic: Denied neurology symptoms, dizziness, headache, neck pain and syncope.   Psychiatric: Denied psychiatric symptoms, anxiety and depression.  Endocrine: Denied endocrine symptoms including hot flashes and night sweats.      Objective:    BP 116/81   Pulse 96   Ht 5\' 3"  (1.6 m)   Wt 198 lb 11.2 oz (90.1 kg)   BMI 35.20 kg/m   Constitutional: Well-developed, well-nourished female in no acute distress Neurological: Alert and oriented to person, place, and time Psychiatric: Mood and affect appropriate Skin: No rashes or lesions Neck: Supple without masses. Trachea is midline.Thyroid is normal size without masses Lymphatics: No cervical, axillary, supraclavicular, or inguinal adenopathy noted Respiratory: Clear to auscultation bilaterally. Good air movement with normal work of breathing. Cardiovascular: Regular rate and rhythm. Extremities grossly normal, nontender with no edema; pulses regular Gastrointestinal: Soft, nontender, nondistended. No masses or  hernias appreciated. No hepatosplenomegaly. No fluid wave. No rebound or guarding. Breast Exam: breast exam in June of this year, deferred today. Genitourinary:  deferred with shared decision making    Rectal: deferred    Autumn Messing, [place]  01/07/23 1:41 PM

## 2023-01-07 NOTE — Assessment & Plan Note (Addendum)
-   Reviewed health maintenance topics as documented below. - Most recent pap smear in 2023, repeat cervical cancer screening due in 2028 . - Satisfied with current contraception. Had some concerns about how long it was safe to not have a period. Reviewed difference between menstrual cycle bleeding and withdrawal bleeding while utilizing hormonal contraception. Discussed decreased risk of endometrial and ovarian cancers with birth control use. Considering switching to oral contraception but desires to wait at this time.  - STI screening accepted.  - Follows with PCP for regular lab work. Plans to follow up with PCP with GI concerns.

## 2023-01-08 LAB — HEP, RPR, HIV PANEL
HIV Screen 4th Generation wRfx: NONREACTIVE
Hepatitis B Surface Ag: NEGATIVE
RPR Ser Ql: NONREACTIVE

## 2023-01-09 ENCOUNTER — Telehealth: Payer: Self-pay

## 2023-01-09 MED ORDER — METRONIDAZOLE 500 MG PO TABS: 500.0000 mg | ORAL_TABLET | Freq: Two times a day (BID) | ORAL | 0 refills | Status: AC

## 2023-01-09 MED ORDER — FLUCONAZOLE 150 MG PO TABS: 150.0000 mg | ORAL_TABLET | Freq: Once | ORAL | 1 refills | Status: AC

## 2023-01-09 NOTE — Addendum Note (Signed)
Addended by: Autumn Messing on: 01/09/2023 01:40 PM   Modules accepted: Orders

## 2023-01-09 NOTE — Telephone Encounter (Signed)
Verified name and DOB. Reviewed bacterial vaginosis diagnosis. Rx for Flagyl and Diflucan provided. Patient expressed understanding of instructions.

## 2023-02-04 ENCOUNTER — Emergency Department
Admission: EM | Admit: 2023-02-04 | Discharge: 2023-02-04 | Disposition: A | Discharge status: Home / Self Care | Payer: Medicaid Other | Attending: Emergency Medicine | Admitting: Emergency Medicine

## 2023-02-04 ENCOUNTER — Telehealth: Payer: Self-pay

## 2023-02-04 ENCOUNTER — Other Ambulatory Visit: Payer: Self-pay

## 2023-02-04 DIAGNOSIS — R1084 Generalized abdominal pain: Secondary | ICD-10-CM | POA: Diagnosis not present

## 2023-02-04 DIAGNOSIS — R1030 Lower abdominal pain, unspecified: Secondary | ICD-10-CM | POA: Diagnosis present

## 2023-02-04 LAB — URINALYSIS, ROUTINE W REFLEX MICROSCOPIC
Bacteria, UA: NONE SEEN
RBC / HPF: >50 RBC/hpf (ref 0–5)
WBC, UA: 0 WBC/hpf (ref 0–5)

## 2023-02-04 LAB — COMPREHENSIVE METABOLIC PANEL
ALT: 22 U/L (ref 0–44)
AST: 19 U/L (ref 15–41)
Albumin: 4.5 g/dL (ref 3.5–5.0)
Alkaline Phosphatase: 47 U/L (ref 38–126)
Anion gap: 8 (ref 5–15)
BUN: 12 mg/dL (ref 6–20)
CO2: 22 mmol/L (ref 22–32)
Calcium: 8.8 mg/dL — ABNORMAL LOW (ref 8.9–10.3)
Chloride: 106 mmol/L (ref 98–111)
Creatinine, Ser: 0.67 mg/dL (ref 0.44–1.00)
GFR, Estimated: >60 mL/min (ref >60)
Glucose, Bld: 106 mg/dL — ABNORMAL HIGH (ref 70–99)
Potassium: 3.8 mmol/L (ref 3.5–5.1)
Sodium: 136 mmol/L (ref 135–145)
Total Bilirubin: 0.6 mg/dL (ref 0.3–1.2)
Total Protein: 8.2 g/dL — ABNORMAL HIGH (ref 6.5–8.1)

## 2023-02-04 LAB — CBC
HCT: 38.5 % (ref 36.0–46.0)
Hemoglobin: 13.2 g/dL (ref 12.0–15.0)
MCH: 29.1 pg (ref 26.0–34.0)
MCHC: 34.3 g/dL (ref 30.0–36.0)
MCV: 85 fL (ref 80.0–100.0)
Platelets: 234 10*3/uL (ref 150–400)
RBC: 4.53 MIL/uL (ref 3.87–5.11)
RDW: 13.1 % (ref 11.5–15.5)
WBC: 5.8 10*3/uL (ref 4.0–10.5)
nRBC: 0 % (ref 0.0–0.2)

## 2023-02-04 LAB — LIPASE, BLOOD: Lipase: 44 U/L (ref 11–51)

## 2023-02-04 LAB — POC URINE PREG, ED: Preg Test, Ur: NEGATIVE

## 2023-02-04 MED ORDER — DICYCLOMINE HCL 10 MG PO CAPS: 10.0000 mg | ORAL_CAPSULE | Freq: Three times a day (TID) | ORAL | 0 refills | Status: AC | PRN

## 2023-02-04 MED ORDER — HYDROCORTISONE ACETATE 25 MG RE SUPP: 25.0000 mg | Freq: Two times a day (BID) | RECTAL | 1 refills | Status: AC

## 2023-02-04 NOTE — Telephone Encounter (Signed)
Patient called in to schedule office visit. She is a patient of [facility] I informed her she will have to go back to them.

## 2023-02-04 NOTE — ED Provider Notes (Signed)
[facility] Provider Note    Event Date/Time   First MD Initiated Contact with Patient 02/04/23 1104     (approximate)   History   Abdominal Pain   HPI  Margaret Krueger is a 32 y.o. female dents to the emergency department today because of concerns for abdominal pain and GI bleeding.  The patient states symptoms have been present for a couple of months.  They have gotten a little worse today.  The patient states she will notice of bright red blood with her stool.  This is accompanied by lower abdominal discomfort.  Patient denies any recent fevers.     Physical Exam   Triage Vital Signs: ED Triage Vitals [02/04/23 0910]  Encounter Vitals Group     BP (!) 133/100     Systolic BP Percentile      Diastolic BP Percentile      Pulse Rate 100     Resp 18     Temp 98 F (36.7 C)     Temp src      SpO2 100 %     Weight      Height      Head Circumference      Peak Flow      Pain Score 10     Pain Loc      Pain Education      Exclude from Growth Chart     Most recent vital signs: Vitals:   02/04/23 0910  BP: (!) 133/100  Pulse: 100  Resp: 18  Temp: 98 F (36.7 C)  SpO2: 100%   General: Awake, alert, oriented. CV:  Good peripheral perfusion. Regular rate and rhythm. Resp:  Normal effort. Lungs clear. Abd:  No distention. No tenderness.   ED Results / Procedures / Treatments   Labs (all labs ordered are listed, but only abnormal results are displayed) Labs Reviewed  COMPREHENSIVE METABOLIC PANEL - Abnormal; Notable for the following components:      Result Value   Glucose, Bld 106 (*)    Calcium 8.8 (*)    Total Protein 8.2 (*)    All other components within normal limits  URINALYSIS, ROUTINE W REFLEX MICROSCOPIC - Abnormal; Notable for the following components:   Color, Urine RED (*)    APPearance CLOUDY (*)    Glucose, UA   (*)    Value: TEST NOT REPORTED DUE TO COLOR INTERFERENCE OF URINE PIGMENT   Hgb urine dipstick   (*)     Value: TEST NOT REPORTED DUE TO COLOR INTERFERENCE OF URINE PIGMENT   Bilirubin Urine   (*)    Value: TEST NOT REPORTED DUE TO COLOR INTERFERENCE OF URINE PIGMENT   Ketones, ur   (*)    Value: TEST NOT REPORTED DUE TO COLOR INTERFERENCE OF URINE PIGMENT   Protein, ur   (*)    Value: TEST NOT REPORTED DUE TO COLOR INTERFERENCE OF URINE PIGMENT   Nitrite   (*)    Value: TEST NOT REPORTED DUE TO COLOR INTERFERENCE OF URINE PIGMENT   Leukocytes,Ua   (*)    Value: TEST NOT REPORTED DUE TO COLOR INTERFERENCE OF URINE PIGMENT   All other components within normal limits  LIPASE, BLOOD  CBC  POC URINE PREG, ED     EKG  None   RADIOLOGY None   PROCEDURES:  Critical Care performed: No   MEDICATIONS ORDERED IN ED: Medications - No data to display   IMPRESSION / MDM /  ASSESSMENT AND PLAN / ED COURSE  I reviewed the triage vital signs and the nursing notes.                              Differential diagnosis includes, but is not limited to, hemorrhoid, IBS, diverticulitis  Patient's presentation is most consistent with acute presentation with potential threat to life or bodily function.  Patient presented to the emergency department today because of concerns for abdominal pain and GI bleeding.  On exam patient without any significant abdominal tenderness.  Patient is neither tachycardic nor hypotensive.  Blood work without any anemia or leukocytosis.  This point somewhat unclear etiology of the patient's symptoms.  We did discuss possibility of obtaining CT scan however patient declined at this time.  I think this is reasonable.  We did discuss importance of GI follow-up.  Will prescribe medication in case it is hemorrhoid as well as Bentyl to help with abdominal cramping.     FINAL CLINICAL IMPRESSION(S) / ED DIAGNOSES   Final diagnoses:  Generalized abdominal pain     Note:  This document was prepared using Dragon voice recognition software and may include unintentional  dictation errors.    Phineas Semen, MD 02/04/23 [phone]

## 2023-02-04 NOTE — Discharge Instructions (Signed)
Please seek medical attention for any high fevers, chest pain, shortness of breath, change in behavior, persistent vomiting, bloody stool or any other new or concerning symptoms.  

## 2023-02-04 NOTE — ED Triage Notes (Signed)
Pt comes with c/o abdominal pain for over month. Pt states she started to have dark stools 2 months ago. Pt states here recently it is bright red. Pt states some nausea, denies any vomiting.

## 2023-02-05 ENCOUNTER — Telehealth: Payer: Self-pay

## 2023-02-05 NOTE — Telephone Encounter (Signed)
Pt calling; wants a call back; is having a very abnormal period.  [phone]  Pt states that since Saturday she has been passing medium sized clots and is soaking through tampon and pad q26min-1hr.  Adv pt to go to the ER.  Pt agreed.

## 2023-08-05 ENCOUNTER — Other Ambulatory Visit: Payer: Self-pay

## 2023-08-05 ENCOUNTER — Emergency Department
Admission: EM | Admit: 2023-08-05 | Discharge: 2023-08-05 | Disposition: A | Discharge status: Home / Self Care | Attending: Emergency Medicine | Admitting: Emergency Medicine

## 2023-08-05 ENCOUNTER — Emergency Department

## 2023-08-05 DIAGNOSIS — R079 Chest pain, unspecified: Secondary | ICD-10-CM | POA: Diagnosis present

## 2023-08-05 DIAGNOSIS — R110 Nausea: Secondary | ICD-10-CM | POA: Insufficient documentation

## 2023-08-05 LAB — D-DIMER, QUANTITATIVE: D-Dimer, Quant: <0.27 ug{FEU}/mL (ref 0.00–0.50)

## 2023-08-05 LAB — BASIC METABOLIC PANEL WITH GFR
Anion gap: 9 (ref 5–15)
BUN: 13 mg/dL (ref 6–20)
CO2: 23 mmol/L (ref 22–32)
Calcium: 9.3 mg/dL (ref 8.9–10.3)
Chloride: 105 mmol/L (ref 98–111)
Creatinine, Ser: 0.74 mg/dL (ref 0.44–1.00)
GFR, Estimated: >60 mL/min (ref >60)
Glucose, Bld: 108 mg/dL — ABNORMAL HIGH (ref 70–99)
Potassium: 4.2 mmol/L (ref 3.5–5.1)
Sodium: 137 mmol/L (ref 135–145)

## 2023-08-05 LAB — POC URINE PREG, ED: Preg Test, Ur: NEGATIVE

## 2023-08-05 LAB — CBC
HCT: 42.8 % (ref 36.0–46.0)
Hemoglobin: 13.9 g/dL (ref 12.0–15.0)
MCH: 28.5 pg (ref 26.0–34.0)
MCHC: 32.5 g/dL (ref 30.0–36.0)
MCV: 87.9 fL (ref 80.0–100.0)
Platelets: 211 10*3/uL (ref 150–400)
RBC: 4.87 MIL/uL (ref 3.87–5.11)
RDW: 13.1 % (ref 11.5–15.5)
WBC: 6.5 10*3/uL (ref 4.0–10.5)
nRBC: 0.3 % — ABNORMAL HIGH (ref 0.0–0.2)

## 2023-08-05 LAB — TROPONIN I (HIGH SENSITIVITY): Troponin I (High Sensitivity): <2 ng/L (ref <18)

## 2023-08-05 MED ORDER — ONDANSETRON 4 MG PO TBDP: 4.0000 mg | ORAL_TABLET | Freq: Three times a day (TID) | ORAL | 0 refills | Status: DC | PRN

## 2023-08-05 MED ORDER — ALUM & MAG HYDROXIDE-SIMETH 200-200-20 MG/5ML PO SUSP
30.0000 mL | Freq: Once | ORAL | Status: AC
  Administered 2023-08-05: 30 mL via ORAL
  Filled 2023-08-05: qty 30

## 2023-08-05 MED ORDER — OMEPRAZOLE MAGNESIUM 20 MG PO TBEC: 20.0000 mg | DELAYED_RELEASE_TABLET | Freq: Every day | ORAL | 0 refills | Status: AC

## 2023-08-05 MED ORDER — LIDOCAINE VISCOUS HCL 2 % MT SOLN
15.0000 mL | Freq: Once | OROMUCOSAL | Status: AC
  Administered 2023-08-05: 15 mL via ORAL
  Filled 2023-08-05: qty 15

## 2023-08-05 MED ORDER — FAMOTIDINE 20 MG PO TABS
20.0000 mg | ORAL_TABLET | Freq: Once | ORAL | Status: AC
  Administered 2023-08-05: 20 mg via ORAL
  Filled 2023-08-05: qty 1

## 2023-08-05 NOTE — Discharge Instructions (Signed)
 You were seen in the emergency department for chest pain.  You got a chest x-ray that looked normal.  Your EKG was normal and you had normal heart enzymes, not concerned that you have a heart attack today.  You had a screening test and I have a low suspicion for any blood clots or tears in your aorta.  Concerned that your symptoms are from acid reflux.  Stay hydrated and drink plenty of fluids.  Avoid any ibuprofen or NSAIDs.  Try to decrease/stop your nicotine use.  Avoid energy drinks.  You were started on an acid reducing medication, take this daily until you follow-up with your primary care physician.  Return to the emergency department for any worsening symptoms.  Pain control:  Acetaminophen (tylenol) - You can take 2 extra strength tablets (1000 mg) every 6 hours as needed for pain/fever.

## 2023-08-05 NOTE — ED Triage Notes (Signed)
 Pt presents to ED with c/o of CP and SOB since Saturday, pt is a everyday smoker. NAD noted. Pt states similar episode in past.

## 2023-08-05 NOTE — ED Provider Notes (Signed)
 [facility] Provider Note    Event Date/Time   First MD Initiated Contact with Patient 08/05/23 [phone]     (approximate)   History   Shortness of Breath and Chest Pain   HPI  Margaret Krueger is a 33 y.o. female past medical history significant for nicotine use, who presents to the emergency department with chest pain.  Patient states that she started having chest pain yesterday.  Complaining of a sharp stabbing pain to the middle of her chest that will intermittently radiate to her back.  Nothing improves or worsens and associated with some mild nausea.  No significant shortness of breath.  Denies cough.  Does endorse vaping nicotine.  Denies any alcohol or marijuana use.  Not on OCPs.  No history of DVT or PE.  Did have an episode where she felt like both of her legs were tingling and her whole body was tingling yesterday that she thought might be a panic attack but then with her symptoms today she wanted to be evaluated.  Called her primary care physician on-call, told come to the emergency department     Physical Exam   Triage Vital Signs: ED Triage Vitals  Encounter Vitals Group     BP 08/05/23 0840 128/84     Systolic BP Percentile --      Diastolic BP Percentile --      Pulse Rate 08/05/23 0840 98     Resp 08/05/23 0840 18     Temp 08/05/23 0840 98 F (36.7 C)     Temp Source 08/05/23 0840 Oral     SpO2 08/05/23 0840 98 %     Weight 08/05/23 0842 200 lb (90.7 kg)     Height 08/05/23 0842 5\' 3"  (1.6 m)     Head Circumference --      Peak Flow --      Pain Score 08/05/23 0842 7     Pain Loc --      Pain Education --      Exclude from Growth Chart --     Most recent vital signs: Vitals:   08/05/23 0840  BP: 128/84  Pulse: 98  Resp: 18  Temp: 98 F (36.7 C)  SpO2: 98%    Physical Exam Constitutional:      Appearance: She is well-developed.  HENT:     Head: Atraumatic.  Eyes:     Conjunctiva/sclera: Conjunctivae normal.   Cardiovascular:     Rate and Rhythm: Regular rhythm.  Pulmonary:     Effort: Pulmonary effort is normal. No respiratory distress.  Chest:     Chest wall: No tenderness.  Abdominal:     General: There is no distension.     Palpations: Abdomen is soft.     Tenderness: There is no abdominal tenderness.  Musculoskeletal:        General: Normal range of motion.     Cervical back: Normal range of motion.     Right lower leg: No edema.     Left lower leg: No edema.     Comments: No unilateral leg swelling  Skin:    General: Skin is warm.     Capillary Refill: Capillary refill takes less than 2 seconds.  Neurological:     Mental Status: She is alert. Mental status is at baseline.     IMPRESSION / MDM / ASSESSMENT AND PLAN / ED COURSE  I reviewed the triage vital signs and the nursing notes.  Differential diagnosis including  ACS, pneumonia, anemia, gastritis/PUD, pregnancy, dissection, pulmonary embolism, symptomatic cholelithiasis, acute cholecystitis  EKG  I, Corena Herter, the attending physician, personally viewed and interpreted this ECG.   Rate: Normal  Rhythm: Normal sinus  Axis: Normal  Intervals: Normal  ST&T Change: None  No tachycardic or bradycardic dysrhythmias while on cardiac telemetry.  RADIOLOGY I independently reviewed imaging, my interpretation of imaging: Chest x-ray no signs of pneumonia  LABS (all labs ordered are listed, but only abnormal results are displayed) Labs interpreted as -    Labs Reviewed  BASIC METABOLIC PANEL WITH GFR - Abnormal; Notable for the following components:      Result Value   Glucose, Bld 108 (*)    All other components within normal limits  CBC - Abnormal; Notable for the following components:   nRBC 0.3 (*)    All other components within normal limits  D-DIMER, QUANTITATIVE  POC URINE PREG, ED  TROPONIN I (HIGH SENSITIVITY)  TROPONIN I (HIGH SENSITIVITY)     MDM  Low suspicion for ACS.  Low risk heart score  and troponin is undetectable, chest pain has been ongoing for the past 1 day do not feel that serial troponin is necessary at this time.  Low risk Wells criteria, D-dimer is negative.  Lab work with no significant leukocytosis or anemia.  Patient treated with Pepcid and GI cocktail  On reevaluation patient states she had significant improvement of her pain.  Feeling much better.  No tearing chest pain and no other risk factors for dissection given her normal D-dimer have a very low suspicion for dissection or pulmonary embolism and do not feel that further CT imaging is necessary at this time.  Repeat abdominal exam continues to be benign with no significant right upper quadrant abdominal pain.  Have low suspicion for acute cholecystitis.  Patient otherwise states that she is feeling much better.  Will treat the patient with PPI.  Discussed smoking cessation, change in diet and close follow-up with primary care physician.  Discussed return for any ongoing or worsening symptoms.     PROCEDURES:  Critical Care performed: No  Procedures  Patient's presentation is most consistent with acute presentation with potential threat to life or bodily function.   MEDICATIONS ORDERED IN ED: Medications  famotidine (PEPCID) tablet 20 mg (20 mg Oral Given 08/05/23 1000)  alum & mag hydroxide-simeth (MAALOX/MYLANTA) 200-200-20 MG/5ML suspension 30 mL (30 mLs Oral Given 08/05/23 1000)    And  lidocaine (XYLOCAINE) 2 % viscous mouth solution 15 mL (15 mLs Oral Given 08/05/23 1000)    FINAL CLINICAL IMPRESSION(S) / ED DIAGNOSES   Final diagnoses:  Chest pain, unspecified type     Rx / DC Orders   ED Discharge Orders          Ordered    omeprazole (PRILOSEC OTC) 20 MG tablet  Daily        08/05/23 1027    ondansetron (ZOFRAN-ODT) 4 MG disintegrating tablet  Every 8 hours PRN        08/05/23 1027             Note:  This document was prepared using Dragon voice recognition software and may  include unintentional dictation errors.   Corena Herter, MD 08/05/23 1253

## 2023-10-09 ENCOUNTER — Ambulatory Visit (INDEPENDENT_AMBULATORY_CARE_PROVIDER_SITE_OTHER)

## 2023-10-09 VITALS — BP 106/63 | HR 96 | Wt 201.1 lb

## 2023-10-09 DIAGNOSIS — Z3687 Encounter for antenatal screening for uncertain dates: Secondary | ICD-10-CM

## 2023-10-09 DIAGNOSIS — N912 Amenorrhea, unspecified: Secondary | ICD-10-CM

## 2023-10-09 DIAGNOSIS — Z3201 Encounter for pregnancy test, result positive: Secondary | ICD-10-CM

## 2023-10-09 DIAGNOSIS — Z3200 Encounter for pregnancy test, result unknown: Secondary | ICD-10-CM

## 2023-10-09 LAB — POCT URINE PREGNANCY: Preg Test, Ur: POSITIVE — AB

## 2023-10-09 NOTE — Progress Notes (Signed)
    NURSE VISIT NOTE  Subjective:    Patient ID: Robynne Roat, female    DOB: December 19, 1990, 33 y.o.   MRN: [id number]  HPI  Patient is a 33 y.o. G50P1001 female who presents for evaluation of amenorrhea. She believes she could be pregnant. Pregnancy is not desired. Sexual Activity: single partner, contraception: none. Current symptoms also include: fatigue, nausea, positive home pregnancy test, and cramping. Last period has been irregular since stopping the depo injection in December 2024. Patients states she is unaware of her LMP because her cycles were irregular. Objective:    BP 106/63   Pulse 96   Wt 201 lb 1.6 oz (91.2 kg)   LMP  (LMP Unknown)   BMI 35.62 kg/m   Lab Review  Results for orders placed or performed in visit on 10/09/23  POCT urine pregnancy  Result Value Ref Range   Preg Test, Ur Positive (A) Negative    Assessment:   1. Possible pregnancy     Plan:   Will schedule dating ultrasound before her NOB intake.      Aldona Amel, CMA

## 2023-10-09 NOTE — Addendum Note (Signed)
 Addended by: Aldona Amel on: 10/09/2023 10:04 AM   Modules accepted: Orders

## 2023-10-11 ENCOUNTER — Ambulatory Visit

## 2023-10-11 DIAGNOSIS — Z3A01 Less than 8 weeks gestation of pregnancy: Secondary | ICD-10-CM | POA: Diagnosis not present

## 2023-10-11 DIAGNOSIS — Z3687 Encounter for antenatal screening for uncertain dates: Secondary | ICD-10-CM

## 2023-10-11 DIAGNOSIS — Z3201 Encounter for pregnancy test, result positive: Secondary | ICD-10-CM

## 2023-10-28 ENCOUNTER — Telehealth

## 2023-10-30 ENCOUNTER — Other Ambulatory Visit: Payer: Self-pay

## 2023-10-30 DIAGNOSIS — O219 Vomiting of pregnancy, unspecified: Secondary | ICD-10-CM

## 2023-10-30 MED ORDER — ONDANSETRON 4 MG PO TBDP: 4.0000 mg | ORAL_TABLET | Freq: Three times a day (TID) | ORAL | 0 refills | Status: AC | PRN

## 2023-11-01 ENCOUNTER — Telehealth: Payer: Self-pay

## 2023-11-01 ENCOUNTER — Encounter

## 2023-11-01 DIAGNOSIS — Z3480 Encounter for supervision of other normal pregnancy, unspecified trimester: Secondary | ICD-10-CM | POA: Insufficient documentation

## 2023-11-01 NOTE — Progress Notes (Deleted)
 New OB Intake  I connected with  Margaret Krueger on 11/01/23 at  9:15 AM EDT by MyChart Video Visit and verified that I am speaking with the correct person using two identifiers. Nurse is located at [facility] and pt is located at [employer]  I discussed the limitations, risks, security and privacy concerns of performing an evaluation and management service by telephone and the availability of in person appointments. I also discussed with the patient that there may be a patient responsible charge related to this service. The patient expressed understanding and agreed to proceed.  I explained I am completing New OB Intake today. We discussed her EDD of 05/31/24 that is based on ultrasound. Pt is G***/P***. I reviewed her allergies, medications, Medical/Surgical/OB history, and appropriate screenings. There are cats in the home: {yes/no:63}. If yes: {Desc; indoor/outdoor:13239}. Based on history, this is a/an pregnancy {Complicated/Uncomplicated Pregnancy:20185} . Her obstetrical history is significant for {ob risk factors:10154}.  Patient Active Problem List   Diagnosis Date Noted   Well woman exam 01/07/2023   Encounter for surveillance of injectable contraceptive 01/02/2023   Status post umbilical hernia repair, follow-up exam 09/12/2021   Grade I hemorrhoids 08/04/2021   Umbilical hernia without obstruction and without gangrene 08/04/2021   Iron deficiency anemia 05/08/2019   Chronic hepatitis C without hepatic coma (HCC) 12/30/2018   Methadone  maintenance treatment affecting pregnancy, antepartum (HCC) 11/27/2018   Anemia during pregnancy 11/27/2018    Concerns addressed today:   Delivery Plans:  Plans to deliver at [facility].  Anatomy US  Explained Anatomy US  will be scheduled around [redacted] weeks gestational age.  Labs Discussed genetic screening with patient. Patient *** genetic testing to be drawn at new OB visit. Discussed possible labs to be drawn at new OB  appointment.  COVID Vaccine Patient {HAS/HAS NOT:20194} had COVID vaccine.   Social Determinants of Health Food Insecurity: {qnni7:72521}  Transportation: {transportation:25542} Childcare: Discussed no children allowed at ultrasound appointments.   First visit review I reviewed new OB appt with pt. I explained she will have blood work and pap smear/pelvic exam if indicated. Explained pt will be seen by Jinnie Cookey, CNM at first visit; encounter routed to appropriate provider.   Beola Skeens, CMA 11/01/2023  8:49 AM

## 2023-11-01 NOTE — Telephone Encounter (Signed)
 Pt had NOB Intake appointment 11/01/23 and was cancelled due to prenatal [place]. [place] from [place]  and has an appointment scheduled for next week at clinic where she will have her prenatal care.

## 2023-11-05 NOTE — Progress Notes (Signed)
 This encounter was created in error - please disregard.

## 2023-11-18 ENCOUNTER — Encounter: Admitting: Licensed Practical Nurse

## 2024-01-02 IMAGING — CT CT ABD-PELV W/ CM
2 of 5 series · 15 of 46 positions shown, 17 images · IV contrast (agent unspecified)
Comparison: Complete abdominal sonogram from April 2019.

CLINICAL DATA: A 30-year-old female presents for evaluation of
abdominal pain post umbilical hernia surgery 2 days ago.

EXAM:
CT ABDOMEN AND PELVIS WITH CONTRAST
TECHNIQUE: Multidetector CT imaging of the abdomen and pelvis was performed
using the standard protocol following bolus administration of
intravenous contrast.

[Series 2: abdomen 5.0 · axial · 0.73mm/px · z∈[-1110,-665]mm · 12 of 103 slices shown, 14 images]
[im 7/103  soft-tissue]
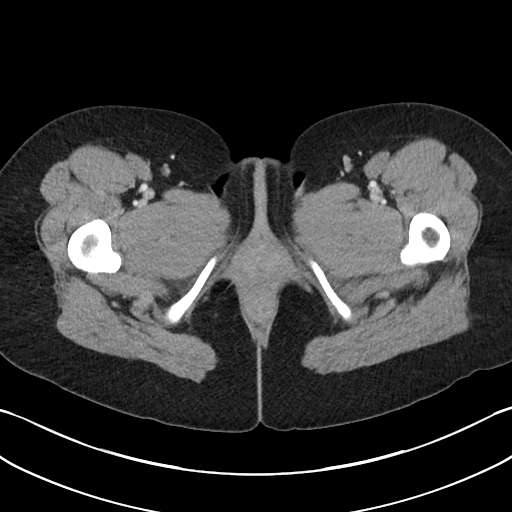
[im 7/103  bone]
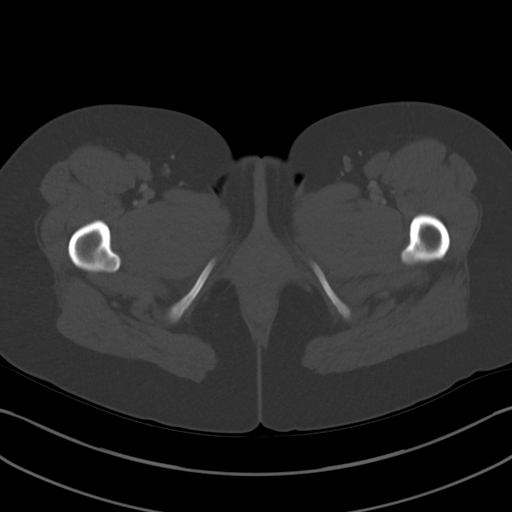
[im 14/103  soft-tissue]
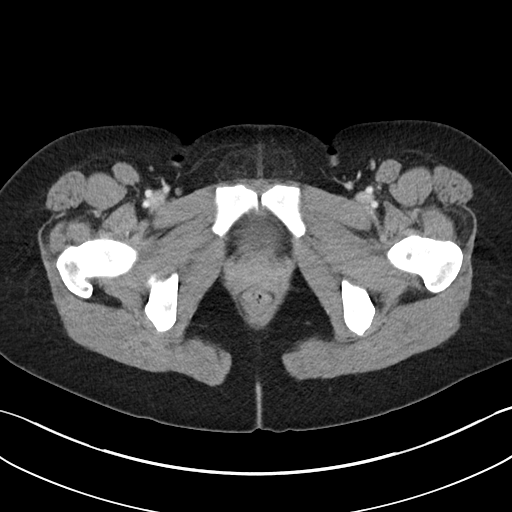
[im 21/103  soft-tissue]
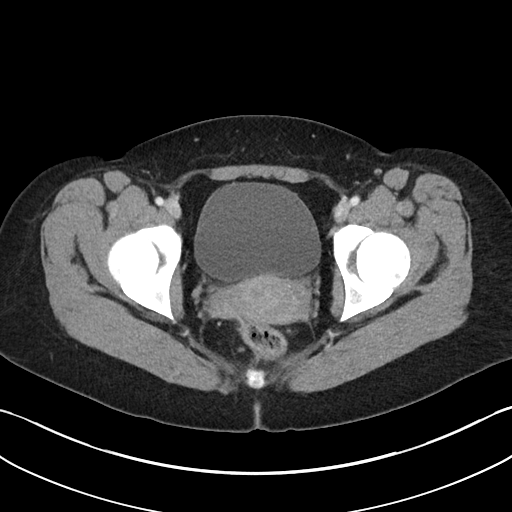
[im 35/103  soft-tissue]
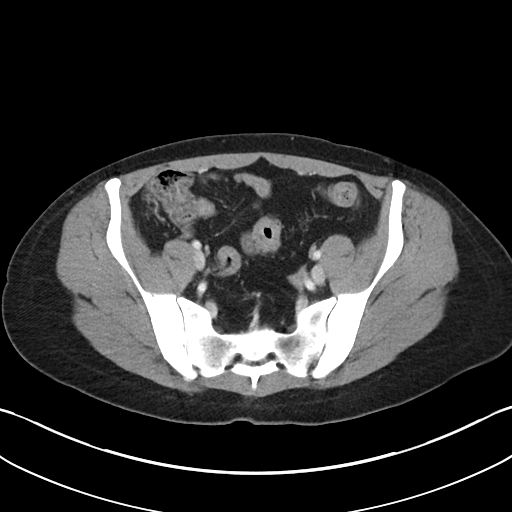
[im 41/103  soft-tissue]
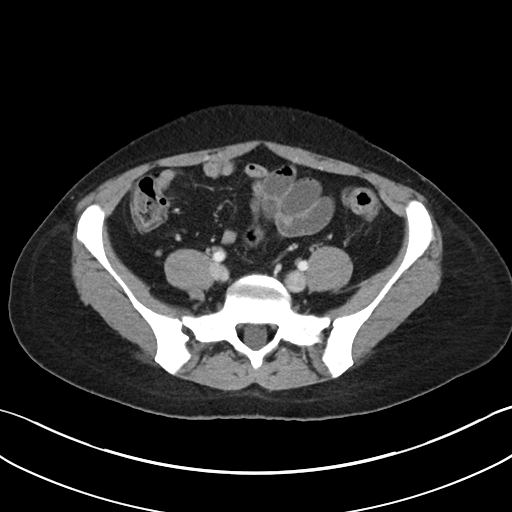
[im 48/103  soft-tissue]
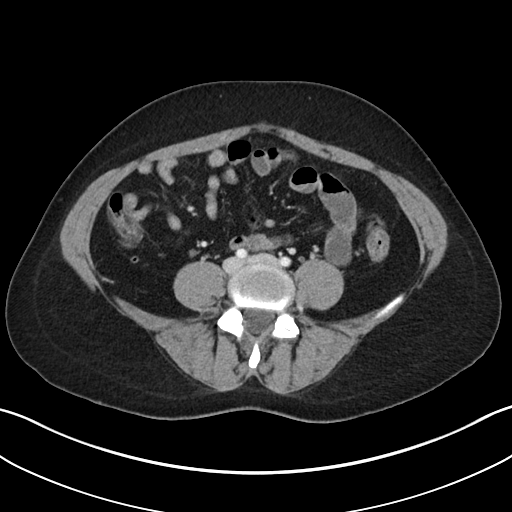
[im 55/103  soft-tissue]
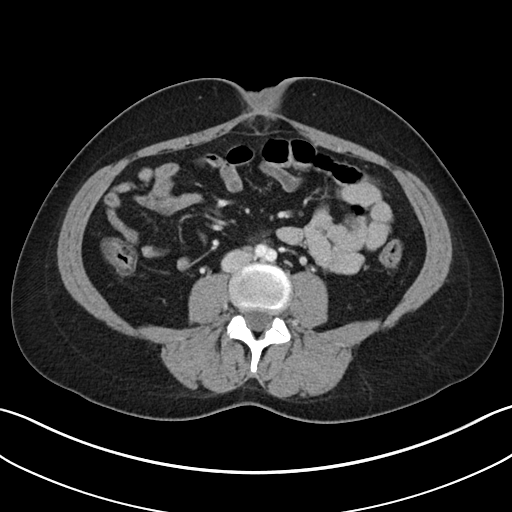
[im 62/103  soft-tissue]
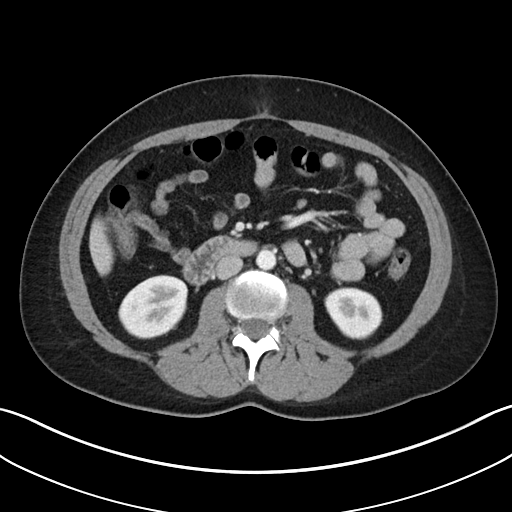
[im 69/103  soft-tissue]
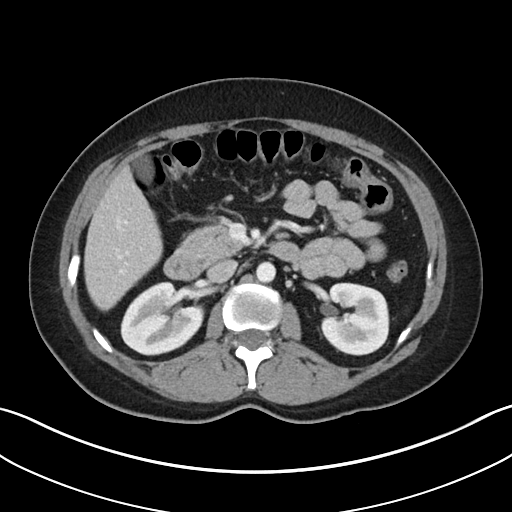
[im 69/103  bone]
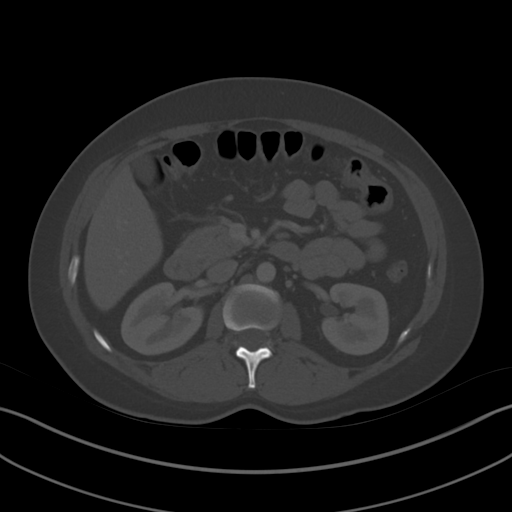
[im 82/103  soft-tissue]
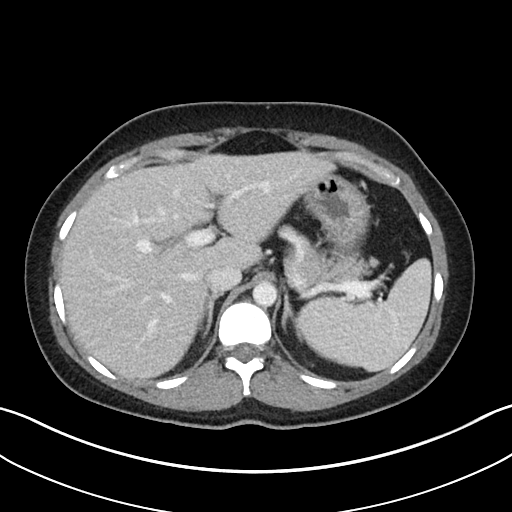
[im 89/103  soft-tissue]
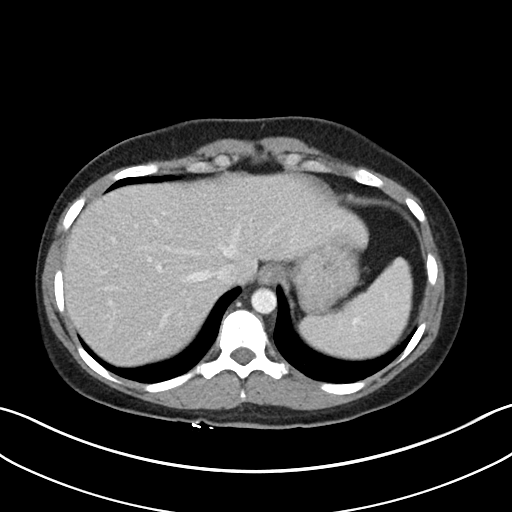
[im 96/103  soft-tissue]
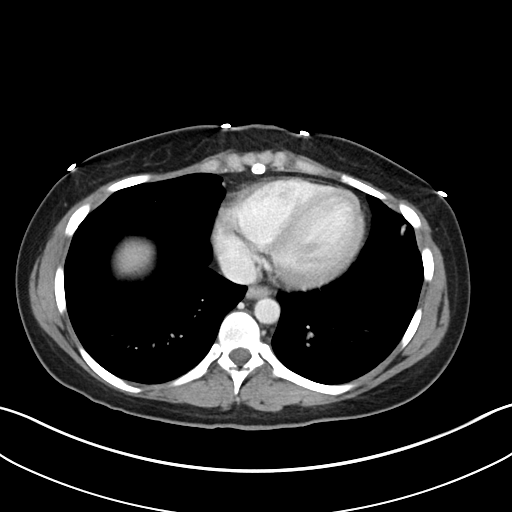

[Series 5: abdomen 3.0 mpr cor · coronal · 0.83mm/px · 3 of 89 slices shown]
[im 30/89  soft-tissue]
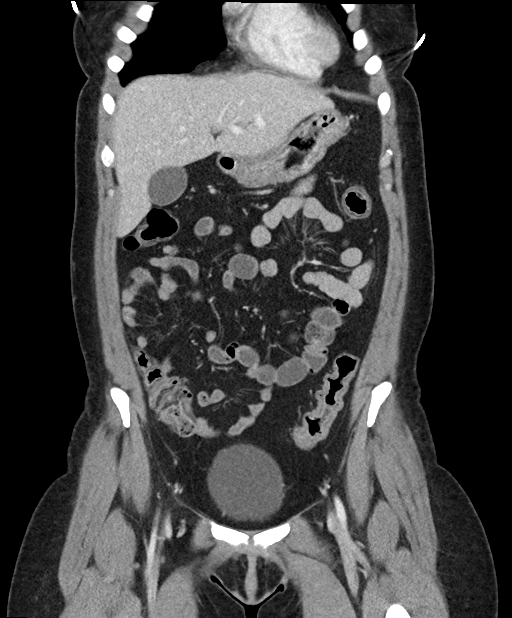
[im 40/89  soft-tissue]
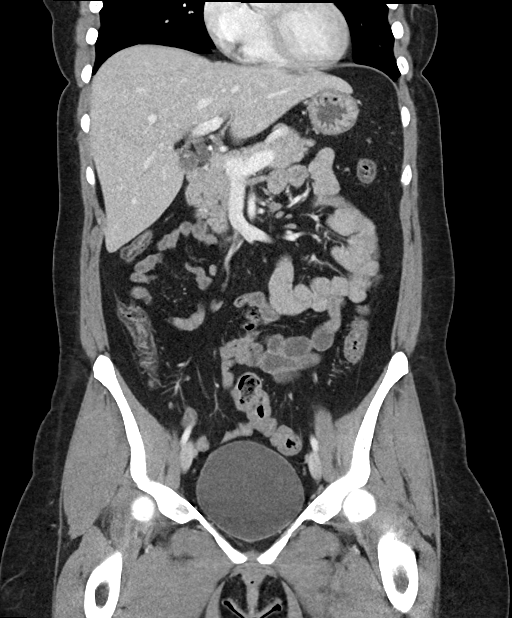
[im 49/89  soft-tissue]
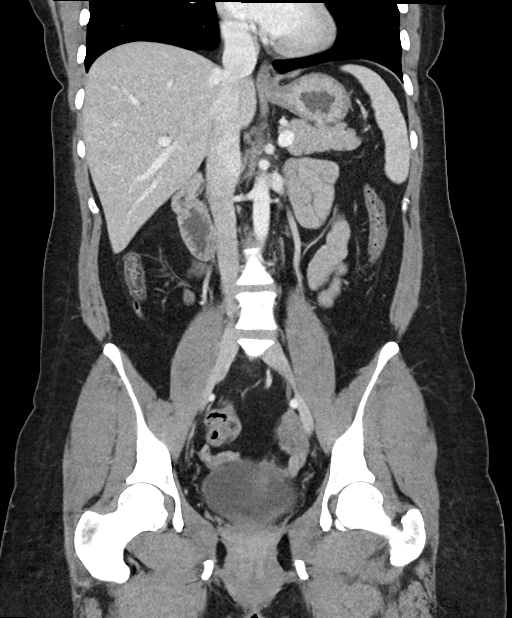

[15 of 46 positions shown; findings below may reference images not displayed]

RADIATION DOSE REDUCTION: This exam was performed according to the
departmental dose-optimization program which includes automated
exposure control, adjustment of the mA and/or kV according to
patient size and/or use of iterative reconstruction technique.

CONTRAST:  100mL OMNIPAQUE IOHEXOL 300 MG/ML  SOLN
FINDINGS: Lower chest: Incidental imaging of the lung bases without effusion
or sign of consolidative changes.

Hepatobiliary: No focal, suspicious hepatic lesion. No
pericholecystic stranding. No biliary duct dilation. Portal vein is
patent.

Pancreas: Normal, without mass, inflammation or ductal dilatation.

Spleen: Normal.

Adrenals/Urinary Tract:

Adrenal glands are unremarkable. Symmetric renal enhancement. No
sign of hydronephrosis. No suspicious renal lesion or perinephric
stranding.

Urinary bladder is grossly unremarkable.

Stomach/Bowel: Normal appendix. Query mild colonic thickening and
mild pericolonic stranding. But this is particularly evident in the
transverse colon. There is some submucosal fat deposition in the
ascending colon.

No stranding adjacent to the stomach or small bowel. No small bowel
dilation.

Vascular/Lymphatic:

Aorta with smooth contours. IVC with smooth contours. No aneurysmal
dilation of the abdominal aorta. There is no gastrohepatic or
hepatoduodenal ligament lymphadenopathy. No retroperitoneal or
mesenteric lymphadenopathy.

No pelvic sidewall lymphadenopathy.

Reproductive: Unremarkable by CT.

Other: No ascites. No free air. Stranding about the umbilicus
presumably related to recent herniorrhaphy. Edema or small amount of
fluid in the umbilicus without focal, peripherally enhancing
collection.

Musculoskeletal: No acute or significant osseous findings. Stranding
about the umbilicus.
IMPRESSION: 1. Stranding about the umbilicus presumably related to recent
herniorrhaphy. Edema or small amount of fluid in the umbilicus
without focal, peripherally enhancing collection. Favor
postoperative change, correlate with any signs of infection.
2. Mild colonic thickening is suggested. Submucosal fat deposition
along the RIGHT colon raise the question of prior inflammation with
more pronounced thickening of transverse colon and
descending/sigmoid. Finding suggest mild acute on potential prior
colitis. Correlate with any repeated history of abdominal pain.
3. Normal appendix.
